# Patient Record
Sex: Female | Born: 1985
Health system: Southern US, Community
[De-identification: ages and names within clinical notes are randomized; demographics above are authoritative.]

## PROBLEM LIST (undated history)

## (undated) DIAGNOSIS — M1990 Unspecified osteoarthritis, unspecified site: Secondary | ICD-10-CM

## (undated) DIAGNOSIS — T8859XA Other complications of anesthesia, initial encounter: Secondary | ICD-10-CM

## (undated) DIAGNOSIS — J45909 Unspecified asthma, uncomplicated: Secondary | ICD-10-CM

## (undated) DIAGNOSIS — T4145XA Adverse effect of unspecified anesthetic, initial encounter: Secondary | ICD-10-CM

## (undated) DIAGNOSIS — K219 Gastro-esophageal reflux disease without esophagitis: Secondary | ICD-10-CM

## (undated) HISTORY — DX: Unspecified asthma, uncomplicated: J45.909

---

## 2010-08-04 ENCOUNTER — Other Ambulatory Visit (HOSPITAL_COMMUNITY): Payer: Self-pay | Admitting: General Surgery

## 2010-08-17 ENCOUNTER — Ambulatory Visit (HOSPITAL_COMMUNITY): Admit: 2010-08-17 | Payer: Self-pay | Admitting: General Surgery

## 2010-08-17 ENCOUNTER — Ambulatory Visit (HOSPITAL_COMMUNITY)
Admission: RE | Admit: 2010-08-17 | Discharge: 2010-08-17 | Disposition: A | Discharge status: Home / Self Care | Payer: BC Managed Care – PPO | Source: Ambulatory Visit | Attending: General Surgery | Admitting: General Surgery

## 2010-08-17 DIAGNOSIS — Z01818 Encounter for other preprocedural examination: Secondary | ICD-10-CM | POA: Insufficient documentation

## 2010-08-26 ENCOUNTER — Ambulatory Visit (HOSPITAL_COMMUNITY)
Admission: RE | Admit: 2010-08-26 | Discharge: 2010-08-26 | Disposition: A | Discharge status: Home / Self Care | Payer: BC Managed Care – PPO | Source: Ambulatory Visit | Attending: General Surgery | Admitting: General Surgery

## 2010-08-26 ENCOUNTER — Encounter (HOSPITAL_COMMUNITY): Payer: Self-pay

## 2010-08-26 ENCOUNTER — Ambulatory Visit: Payer: Self-pay | Admitting: *Deleted

## 2010-08-26 DIAGNOSIS — Z6839 Body mass index (BMI) 39.0-39.9, adult: Secondary | ICD-10-CM | POA: Insufficient documentation

## 2010-08-30 ENCOUNTER — Encounter: Payer: BC Managed Care – PPO | Attending: General Surgery | Admitting: *Deleted

## 2010-08-30 DIAGNOSIS — Z01818 Encounter for other preprocedural examination: Secondary | ICD-10-CM | POA: Insufficient documentation

## 2010-08-30 DIAGNOSIS — Z713 Dietary counseling and surveillance: Secondary | ICD-10-CM | POA: Insufficient documentation

## 2010-11-09 ENCOUNTER — Emergency Department (HOSPITAL_COMMUNITY)
Admission: EM | Admit: 2010-11-09 | Discharge: 2010-11-09 | Disposition: A | Discharge status: Home / Self Care | Payer: BC Managed Care – PPO | Attending: Emergency Medicine | Admitting: Emergency Medicine

## 2010-11-09 DIAGNOSIS — F411 Generalized anxiety disorder: Secondary | ICD-10-CM | POA: Insufficient documentation

## 2010-11-09 DIAGNOSIS — F988 Other specified behavioral and emotional disorders with onset usually occurring in childhood and adolescence: Secondary | ICD-10-CM | POA: Insufficient documentation

## 2010-11-09 DIAGNOSIS — Z79899 Other long term (current) drug therapy: Secondary | ICD-10-CM | POA: Insufficient documentation

## 2010-11-09 DIAGNOSIS — E669 Obesity, unspecified: Secondary | ICD-10-CM | POA: Insufficient documentation

## 2010-11-09 DIAGNOSIS — Y990 Civilian activity done for income or pay: Secondary | ICD-10-CM | POA: Insufficient documentation

## 2010-11-09 DIAGNOSIS — S61209A Unspecified open wound of unspecified finger without damage to nail, initial encounter: Secondary | ICD-10-CM | POA: Insufficient documentation

## 2010-11-09 DIAGNOSIS — W292XXA Contact with other powered household machinery, initial encounter: Secondary | ICD-10-CM | POA: Insufficient documentation

## 2010-11-09 DIAGNOSIS — IMO0002 Reserved for concepts with insufficient information to code with codable children: Secondary | ICD-10-CM | POA: Insufficient documentation

## 2013-02-13 ENCOUNTER — Ambulatory Visit (HOSPITAL_COMMUNITY)
Admission: RE | Admit: 2013-02-13 | Discharge: 2013-02-13 | Disposition: A | Discharge status: Home / Self Care | Payer: Federal, State, Local not specified - PPO | Source: Ambulatory Visit | Attending: Internal Medicine | Admitting: Internal Medicine

## 2013-02-13 ENCOUNTER — Other Ambulatory Visit (HOSPITAL_COMMUNITY): Payer: Self-pay | Admitting: Internal Medicine

## 2013-02-13 DIAGNOSIS — M79609 Pain in unspecified limb: Secondary | ICD-10-CM | POA: Insufficient documentation

## 2013-02-13 DIAGNOSIS — R209 Unspecified disturbances of skin sensation: Secondary | ICD-10-CM | POA: Insufficient documentation

## 2013-02-13 NOTE — Progress Notes (Signed)
*  PRELIMINARY RESULTS* Vascular Ultrasound Left lower extremity venous duplex has been completed.  Preliminary findings: negative for DVT and baker's cyst.   Report called to Dewayne Hatch.   Farrel Demark, RDMS, RVT  02/13/2013, 2:59 PM

## 2013-03-29 ENCOUNTER — Ambulatory Visit: Payer: Federal, State, Local not specified - PPO | Admitting: Neurology

## 2015-11-03 DIAGNOSIS — K080 Exfoliation of teeth due to systemic causes: Secondary | ICD-10-CM | POA: Diagnosis not present

## 2016-03-05 DIAGNOSIS — J069 Acute upper respiratory infection, unspecified: Secondary | ICD-10-CM | POA: Diagnosis not present

## 2016-03-05 DIAGNOSIS — H6691 Otitis media, unspecified, right ear: Secondary | ICD-10-CM | POA: Diagnosis not present

## 2016-03-30 DIAGNOSIS — Z0000 Encounter for general adult medical examination without abnormal findings: Secondary | ICD-10-CM | POA: Diagnosis not present

## 2016-04-07 DIAGNOSIS — F909 Attention-deficit hyperactivity disorder, unspecified type: Secondary | ICD-10-CM | POA: Diagnosis not present

## 2016-04-07 DIAGNOSIS — Z23 Encounter for immunization: Secondary | ICD-10-CM | POA: Diagnosis not present

## 2016-04-07 DIAGNOSIS — Z0000 Encounter for general adult medical examination without abnormal findings: Secondary | ICD-10-CM | POA: Diagnosis not present

## 2016-04-20 DIAGNOSIS — M79641 Pain in right hand: Secondary | ICD-10-CM | POA: Diagnosis not present

## 2016-05-04 DIAGNOSIS — F909 Attention-deficit hyperactivity disorder, unspecified type: Secondary | ICD-10-CM | POA: Diagnosis not present

## 2016-05-05 DIAGNOSIS — M79641 Pain in right hand: Secondary | ICD-10-CM | POA: Diagnosis not present

## 2016-05-07 DIAGNOSIS — M79644 Pain in right finger(s): Secondary | ICD-10-CM | POA: Diagnosis not present

## 2016-05-10 DIAGNOSIS — K080 Exfoliation of teeth due to systemic causes: Secondary | ICD-10-CM | POA: Diagnosis not present

## 2016-05-10 DIAGNOSIS — S63619D Unspecified sprain of unspecified finger, subsequent encounter: Secondary | ICD-10-CM | POA: Diagnosis not present

## 2016-05-31 DIAGNOSIS — S63619D Unspecified sprain of unspecified finger, subsequent encounter: Secondary | ICD-10-CM | POA: Diagnosis not present

## 2016-06-17 DIAGNOSIS — L400 Psoriasis vulgaris: Secondary | ICD-10-CM | POA: Diagnosis not present

## 2016-08-30 DIAGNOSIS — Z6841 Body Mass Index (BMI) 40.0 and over, adult: Secondary | ICD-10-CM | POA: Diagnosis not present

## 2016-08-30 DIAGNOSIS — R3915 Urgency of urination: Secondary | ICD-10-CM | POA: Diagnosis not present

## 2016-08-30 DIAGNOSIS — Z01411 Encounter for gynecological examination (general) (routine) with abnormal findings: Secondary | ICD-10-CM | POA: Diagnosis not present

## 2016-09-01 DIAGNOSIS — L409 Psoriasis, unspecified: Secondary | ICD-10-CM | POA: Diagnosis not present

## 2016-09-01 DIAGNOSIS — M1990 Unspecified osteoarthritis, unspecified site: Secondary | ICD-10-CM | POA: Diagnosis not present

## 2016-09-05 DIAGNOSIS — M25562 Pain in left knee: Secondary | ICD-10-CM | POA: Diagnosis not present

## 2016-09-05 DIAGNOSIS — M25561 Pain in right knee: Secondary | ICD-10-CM | POA: Diagnosis not present

## 2016-09-05 DIAGNOSIS — M79643 Pain in unspecified hand: Secondary | ICD-10-CM | POA: Diagnosis not present

## 2016-09-05 DIAGNOSIS — L409 Psoriasis, unspecified: Secondary | ICD-10-CM | POA: Diagnosis not present

## 2016-09-05 DIAGNOSIS — M2550 Pain in unspecified joint: Secondary | ICD-10-CM | POA: Diagnosis not present

## 2016-09-05 DIAGNOSIS — M79641 Pain in right hand: Secondary | ICD-10-CM | POA: Diagnosis not present

## 2016-09-05 DIAGNOSIS — M79642 Pain in left hand: Secondary | ICD-10-CM | POA: Diagnosis not present

## 2016-09-05 DIAGNOSIS — M25569 Pain in unspecified knee: Secondary | ICD-10-CM | POA: Diagnosis not present

## 2016-09-05 DIAGNOSIS — L4050 Arthropathic psoriasis, unspecified: Secondary | ICD-10-CM | POA: Diagnosis not present

## 2016-10-04 DIAGNOSIS — Z0000 Encounter for general adult medical examination without abnormal findings: Secondary | ICD-10-CM | POA: Diagnosis not present

## 2016-10-11 DIAGNOSIS — F419 Anxiety disorder, unspecified: Secondary | ICD-10-CM | POA: Diagnosis not present

## 2016-10-11 DIAGNOSIS — F909 Attention-deficit hyperactivity disorder, unspecified type: Secondary | ICD-10-CM | POA: Diagnosis not present

## 2016-10-11 DIAGNOSIS — R739 Hyperglycemia, unspecified: Secondary | ICD-10-CM | POA: Diagnosis not present

## 2016-10-12 ENCOUNTER — Other Ambulatory Visit (HOSPITAL_COMMUNITY): Payer: Self-pay | Admitting: Surgery

## 2016-10-20 ENCOUNTER — Encounter: Payer: Self-pay | Admitting: Registered"

## 2016-10-20 ENCOUNTER — Encounter: Payer: Federal, State, Local not specified - PPO | Attending: Surgery | Admitting: Registered"

## 2016-10-20 DIAGNOSIS — E669 Obesity, unspecified: Secondary | ICD-10-CM

## 2016-10-20 DIAGNOSIS — Z6839 Body mass index (BMI) 39.0-39.9, adult: Secondary | ICD-10-CM | POA: Diagnosis not present

## 2016-10-20 NOTE — Progress Notes (Signed)
Pre-Op Assessment Visit:  Pre-Operative Sleeve Gastrectomy Surgery  Medical Nutrition Therapy:  Appt start time: 2:05   End time:  3:00  Patient was seen on 10/20/2016 for Pre-Operative Nutrition Assessment. Assessment and letter of approval faxed to [facility] Surgery Bariatric Surgery Program coordinator on 10/20/2016.   Pt expectation of surgery: joints to feel better, lose weight  Pt expectation of Dietitian: foods and vitamins recommendations  Start weight at [facility]: 243.9 BMI: 39.97  Pt prefers to be called Abigail Morales. Pt states that she went through process for bariatric surgery in 2014 but did not submit to insurance. Pt states she felt she was not ready at that time, but she is ready now. Pt states she has been recently diagnosed with psoratic arthritis. Pt states she has eliminated fried foods to help manage arthritis. Pt reports she used to participate in Crossfit and lost about 40lbs but discontinued due to joint pain. Pt states she walks her 2 dogs about 60-90 min/day.   Pt states she needs assessment + 3 SWL visits.  24 hr Dietary Recall: First Meal: frozen omelet Snack: cheese, nuts, raisins Second Meal: shrimp and rice pepper bowl Snack: none Third Meal: 2 stuffed peppers  Snack: none Beverages: water, sometimes Sprite (2-3 times/wk)  Encouraged to engage in 150 minutes of moderate physical activity including cardiovascular and weight baring weekly  Handouts given during visit include:  . Pre-Op Goals . Bariatric Surgery Protein Shakes During the appointment today the following Pre-Op Goals were reviewed with the patient: . Maintain or lose weight as instructed by your surgeon . Make healthy food choices . Begin to limit portion sizes . Limited concentrated sugars and fried foods . Keep fat/sugar in the single digits per serving on             food labels . Practice CHEWING your food  (aim for 30 chews per bite or until applesauce consistency) . Practice not  drinking 15 minutes before, during, and 30 minutes after each meal/snack . Avoid all carbonated beverages  . Avoid/limit caffeinated beverages  . Avoid all sugar-sweetened beverages . Consume 3 meals per day; eat every 3-5 hours . Make a list of non-food related activities . Aim for 64-100 ounces of FLUID daily  . Aim for at least 60-80 grams of PROTEIN daily . Look for a liquid protein source that contain ?15 g protein and ?5 g carbohydrate  (ex: shakes, drinks, shots)  -Follow diet recommendations listed below   Energy and Macronutrient Recomendations: Calories: 1600 Carbohydrate: 180 Protein: 120 Fat: 44  Demonstrated degree of understanding via:  Teach Back  Teaching Method Utilized:  Visual Auditory Hands on  Barriers to learning/adherence to lifestyle change: none  Patient to call the [facility] to enroll in Pre-Op and Post-Op Nutrition Education when surgery date is scheduled.

## 2016-10-20 NOTE — Patient Instructions (Addendum)
-   Practice CHEWING your food  (aim for 30 chews per bite or until applesauce consistency)  - Practice not drinking 15 minutes before, during, and 30 minutes after each meal/snack  - Avoid all carbonated beverages

## 2016-10-25 ENCOUNTER — Other Ambulatory Visit: Payer: Self-pay

## 2016-10-25 ENCOUNTER — Ambulatory Visit (HOSPITAL_COMMUNITY)
Admission: RE | Admit: 2016-10-25 | Discharge: 2016-10-25 | Disposition: A | Discharge status: Home / Self Care | Payer: Federal, State, Local not specified - PPO | Source: Ambulatory Visit | Attending: Surgery | Admitting: Surgery

## 2016-10-25 DIAGNOSIS — Z01810 Encounter for preprocedural cardiovascular examination: Secondary | ICD-10-CM | POA: Insufficient documentation

## 2016-10-25 DIAGNOSIS — Z01818 Encounter for other preprocedural examination: Secondary | ICD-10-CM | POA: Diagnosis not present

## 2016-10-27 DIAGNOSIS — F509 Eating disorder, unspecified: Secondary | ICD-10-CM | POA: Diagnosis not present

## 2016-10-31 ENCOUNTER — Encounter: Payer: Self-pay | Admitting: Registered"

## 2016-10-31 ENCOUNTER — Encounter: Payer: Federal, State, Local not specified - PPO | Admitting: Registered"

## 2016-10-31 DIAGNOSIS — E669 Obesity, unspecified: Secondary | ICD-10-CM

## 2016-10-31 DIAGNOSIS — Z6839 Body mass index (BMI) 39.0-39.9, adult: Secondary | ICD-10-CM | POA: Diagnosis not present

## 2016-10-31 NOTE — Progress Notes (Signed)
Appt start time: 5:00 end time: 5:15  Assessment: 1st SWL Appointment.   Start Wt at [facility]: 243.9 Wt: 246.5 BMI: 40.4   Pt prefers to be called [place]. Pt arrives having gained 2.5 pounds. Pt states she has mini fridge in her office at work and able to add snacks and eat them throughout the day. Pt states she still walks her dogs 60-90 min/day as physical activity. Pt states she's had flare ups last weekend with arthritis due to doing a lot of yard work. Pt reports she's only had 1 carbonated beverage since our last visit and has been doing well with chewing 30 times per bite. Pt also states she has been more conscious of her drinking at meals and able to wait 15 min prior to meal, not drink during meal, and wait 30 min after meal to drink.   Pt states she needs assessment + 3 SWL visits.  Preferred Learning Style:   No preference indicated   Learning Readiness:   Ready  Change in progress  MEDICATIONS: See list   DIETARY INTAKE:  24-hr recall:  B ( AM): frozen omelet with pico  Snk ( AM): none  L ( PM): cheese, chicken, and green peppers  Snk ( PM): none D ( PM): steak, vegetable Snk ( PM): none Beverages: water, orange juice   Usual physical activity: walking dogs 60-90 min/day  Diet to Follow: 1600 calories 180 g carbohydrates 120 g protein 44 g fat   Nutritional Diagnosis:  [place]-3.3 Overweight/obesity related to past poor dietary habits and physical inactivity as evidenced by patient w/ upcoming sleeve gastrectomy surgery following dietary guidelines for continued weight loss.    Intervention:  Nutrition counseling for upcoming Bariatric Surgery.  Teaching Method Utilized:  Visual Auditory   Handouts given during visit include:  101 Things to do besides eat  Barriers to learning/adherence to lifestyle change: none   Demonstrated degree of understanding via:  Teach Back   Monitoring/Evaluation:  Dietary intake, exercise, and body weight in 1 month(s).

## 2016-10-31 NOTE — Patient Instructions (Addendum)
-   Adding in string cheese and greek yogurt for mini fridge at work.  - Make a list of things to do when feeling stressed, bored, emotional, etc.

## 2016-11-07 ENCOUNTER — Ambulatory Visit: Payer: Federal, State, Local not specified - PPO | Admitting: Registered"

## 2016-11-08 DIAGNOSIS — G5601 Carpal tunnel syndrome, right upper limb: Secondary | ICD-10-CM | POA: Diagnosis not present

## 2016-11-15 DIAGNOSIS — K080 Exfoliation of teeth due to systemic causes: Secondary | ICD-10-CM | POA: Diagnosis not present

## 2016-11-17 DIAGNOSIS — F509 Eating disorder, unspecified: Secondary | ICD-10-CM | POA: Diagnosis not present

## 2016-11-29 ENCOUNTER — Encounter: Payer: Self-pay | Admitting: Registered"

## 2016-11-29 ENCOUNTER — Encounter: Payer: Federal, State, Local not specified - PPO | Attending: Surgery | Admitting: Registered"

## 2016-11-29 DIAGNOSIS — Z6839 Body mass index (BMI) 39.0-39.9, adult: Secondary | ICD-10-CM | POA: Insufficient documentation

## 2016-11-29 DIAGNOSIS — E669 Obesity, unspecified: Secondary | ICD-10-CM

## 2016-11-29 NOTE — Patient Instructions (Addendum)
-   Aim for at least 64 oz of fluid intake daily.   - Find a liquid protein shake option or protein powder that you enjoy with at least 15g of protein and 5g or less of carbohydrates.   - Can mix protein powder with low fat milk, fat free milk, unsweetened almond milk, or water.

## 2016-11-29 NOTE — Progress Notes (Signed)
Appt start time: 12:00 end time: 12:15  Assessment: 2nd SWL Appointment.   Start Wt at [facility]: 243.9 Wt: 251.5 BMI: 41.22   Pt prefers to be called Abigail Morales. Pt arrives having gained 5 lbs from previous visit. Pt states she has mini fridge in her office at work and able to add snacks and eat them throughout the day. Pt states she still walks her dogs 60-90 min/day as physical activity. Pt states she ate a lot this (Mem[facility] Day) weekend. Pt states she is not sure if she is consistently getting 64 oz of fluid daily. Pt states she did not like the protein shakes that she has tried so far. Pt states she has started eating greek yogurt, string cheese, almonds, and fruit as snacks during her day.   Pt states she needs assessment + 3 SWL visits.   Preferred Learning Style:   No preference indicated   Learning Readiness:   Ready  Change in progress  MEDICATIONS: See list   DIETARY INTAKE:  24-hr recall:  B ( AM): frozen omelet with pico or boiled eggs Snk ( AM): cheese stick  L ( PM): leftovers (stuffed peppers-beef, rice, seasoning) Snk ( PM): fruit, almonds D ( PM): tacos Snk ( PM): none Beverages: water, orange juice sometimes, 2% milk (rarely)   Usual physical activity: walking dogs 60-90 min/day  Diet to Follow: 1600 calories 180 g carbohydrates 120 g protein 44 g fat   Nutritional Diagnosis:  [place]-3.3 Overweight/obesity related to past poor dietary habits and physical inactivity as evidenced by patient w/ upcoming sleeve gastrectomy surgery following dietary guidelines for continued weight loss.    Intervention:  Nutrition counseling for upcoming Bariatric Surgery.  Goals: - Aim for at least 64 oz of fluid intake daily.  - Find a liquid protein shake option or protein powder that you enjoy with at least 15g of protein and 5g or less of carbohydrates.  - Can mix protein powder with low fat milk, fat free milk, unsweetened almond milk, or water.  Teaching Method Utilized:   Visual Auditory   Handouts given during visit include:  none  Barriers to learning/adherence to lifestyle change: none   Demonstrated degree of understanding via:  Teach Back   Monitoring/Evaluation:  Dietary intake, exercise, and body weight in 1 month(s).

## 2016-11-30 DIAGNOSIS — L550 Sunburn of first degree: Secondary | ICD-10-CM | POA: Diagnosis not present

## 2016-12-08 DIAGNOSIS — M25562 Pain in left knee: Secondary | ICD-10-CM | POA: Diagnosis not present

## 2016-12-16 DIAGNOSIS — M25562 Pain in left knee: Secondary | ICD-10-CM | POA: Diagnosis not present

## 2016-12-20 DIAGNOSIS — M25562 Pain in left knee: Secondary | ICD-10-CM | POA: Diagnosis not present

## 2016-12-23 DIAGNOSIS — M23242 Derangement of anterior horn of lateral meniscus due to old tear or injury, left knee: Secondary | ICD-10-CM | POA: Diagnosis not present

## 2016-12-23 DIAGNOSIS — G8918 Other acute postprocedural pain: Secondary | ICD-10-CM | POA: Diagnosis not present

## 2016-12-23 DIAGNOSIS — M94262 Chondromalacia, left knee: Secondary | ICD-10-CM | POA: Diagnosis not present

## 2016-12-23 DIAGNOSIS — M659 Synovitis and tenosynovitis, unspecified: Secondary | ICD-10-CM | POA: Diagnosis not present

## 2016-12-23 DIAGNOSIS — M65162 Other infective (teno)synovitis, left knee: Secondary | ICD-10-CM | POA: Diagnosis not present

## 2016-12-23 HISTORY — PX: OTHER SURGICAL HISTORY: SHX169

## 2016-12-26 ENCOUNTER — Encounter: Payer: Self-pay | Admitting: Registered"

## 2016-12-26 ENCOUNTER — Encounter: Payer: Federal, State, Local not specified - PPO | Attending: Surgery | Admitting: Registered"

## 2016-12-26 DIAGNOSIS — Z6839 Body mass index (BMI) 39.0-39.9, adult: Secondary | ICD-10-CM | POA: Diagnosis not present

## 2016-12-26 DIAGNOSIS — E669 Obesity, unspecified: Secondary | ICD-10-CM

## 2016-12-26 NOTE — Patient Instructions (Signed)
-   Continue to practice chewing at least 30 times per bite.  - Continue to not drink 15 min prior to eating, not while eating, and waiting 30 minutes after eating.

## 2016-12-26 NOTE — Progress Notes (Signed)
Appt start time: 4:59 end time: 5:15  Assessment: 3rd SWL Appointment.   Start Wt at [facility]: 243.9 Wt: 248.5 BMI: 40.72   Pt prefers to be called Abigail Morales. Pt arrives having lost 3 lbs from previous visit. Pt states she just had knee surgery last week due to torn meniscus. Pt states she has found a variety of protein shakes that she enjoys and drinks more than 64 oz of fluid daily.    This is the last SWL visit needed prior to surgery.    Preferred Learning Style:   No preference indicated   Learning Readiness:   Ready  Change in progress  MEDICATIONS: See list   DIETARY INTAKE:  24-hr recall:  B ( AM): boiled eggs Snk ( AM): cheese stick or watermelon  L ( PM): leftovers (stuffed peppers-beef, rice, seasoning) Snk ( PM): fruit, almonds D ( PM): smoked pork loin, sometimes vegetable (corn, broccoli, asparagus) Snk ( PM): none Beverages: water, orange juice sometimes, 2% milk (rarely), sprite, and ginger ale   Usual physical activity: walking dogs 60-90 min/day  Diet to Follow: 1600 calories 180 g carbohydrates 120 g protein 44 g fat   Nutritional Diagnosis:  [place]-3.3 Overweight/obesity related to past poor dietary habits and physical inactivity as evidenced by patient w/ upcoming sleeve gastrectomy surgery following dietary guidelines for continued weight loss.    Intervention:  Nutrition counseling for upcoming Bariatric Surgery.  Goals: - Continue to practice chewing at least 30 times per bite. - Continue to not drink 15 min prior to eating, not while eating, and waiting 30 minutes after eating.   Teaching Method Utilized:  Visual Auditory   Handouts given during visit include:  none  Barriers to learning/adherence to lifestyle change: none   Demonstrated degree of understanding via:  Teach Back   Monitoring/Evaluation:  Dietary intake, exercise, and body weight prn.

## 2016-12-29 DIAGNOSIS — Z9889 Other specified postprocedural states: Secondary | ICD-10-CM | POA: Diagnosis not present

## 2017-01-12 DIAGNOSIS — M25562 Pain in left knee: Secondary | ICD-10-CM | POA: Diagnosis not present

## 2017-01-12 DIAGNOSIS — M25462 Effusion, left knee: Secondary | ICD-10-CM | POA: Diagnosis not present

## 2017-01-20 DIAGNOSIS — M25562 Pain in left knee: Secondary | ICD-10-CM | POA: Diagnosis not present

## 2017-01-30 ENCOUNTER — Encounter: Payer: Federal, State, Local not specified - PPO | Attending: Surgery | Admitting: Registered"

## 2017-01-30 ENCOUNTER — Encounter: Payer: Self-pay | Admitting: Registered"

## 2017-01-30 ENCOUNTER — Ambulatory Visit: Payer: Federal, State, Local not specified - PPO

## 2017-01-30 DIAGNOSIS — Z6839 Body mass index (BMI) 39.0-39.9, adult: Secondary | ICD-10-CM | POA: Insufficient documentation

## 2017-01-30 DIAGNOSIS — E669 Obesity, unspecified: Secondary | ICD-10-CM

## 2017-01-30 NOTE — Progress Notes (Signed)
Appt start time: 2:00 end time: 2:15  Assessment: 4th SWL Appointment.   Start Wt at [facility]: 243.9 Wt: 252.9 BMI: 41.44   Pt prefers to be called Lowella BandyNikki. Pt arrives having gained 4.4 lbs from previous visit. Pt states she needed one more visit with us. Pt states she has been working on chewing and not drinking with meals. Pt states she has eliminated ginger ale from from diet because she is not taking medications anymore that used to make her feel sick. Pt states she has not been able to talk as much as before due to recent knee surgery but still walks her dogs about 4-5x/day, 15 min each time.   This is the last SWL visit needed prior to surgery.     Preferred Learning Style:   No preference indicated   Learning Readiness:   Ready  Change in progress  MEDICATIONS: See list   DIETARY INTAKE:  24-hr recall:  B ( AM): boiled eggs Snk ( AM): cheese stick or watermelon  L ( PM): leftovers (stuffed peppers-beef, rice, seasoning) Snk ( PM): fruit, almonds D ( PM): smoked pork loin, sometimes vegetable (corn, broccoli, asparagus) Snk ( PM): none Beverages: water, orange juice sometimes, 2% milk (rarely), sprite  Usual physical activity: walking dogs 15 min/4-5x day   Diet to Follow: 1600 calories 180 g carbohydrates 120 g protein 44 g fat   Nutritional Diagnosis:  [place]-3.3 Overweight/obesity related to past poor dietary habits and physical inactivity as evidenced by patient w/ upcoming sleeve gastrectomy surgery following dietary guidelines for continued weight loss.    Intervention:  Nutrition counseling for upcoming Bariatric Surgery.  Goals: - Get Bariatric-specific vitamins and calcium supplements prior to surgery.  Teaching Method Utilized:  Visual Auditory   Handouts given during visit include:  Vitamin and Mineral Recommendations  Barriers to learning/adherence to lifestyle change: none   Demonstrated degree of understanding via:  Teach Back    Monitoring/Evaluation:  Dietary intake, exercise, and body weight prn.

## 2017-01-30 NOTE — Patient Instructions (Addendum)
-   Get Bariatric-specific vitamins and calcium supplements prior to surgery.

## 2017-02-02 DIAGNOSIS — M25462 Effusion, left knee: Secondary | ICD-10-CM | POA: Diagnosis not present

## 2017-02-13 ENCOUNTER — Ambulatory Visit: Payer: Federal, State, Local not specified - PPO | Admitting: Skilled Nursing Facility1

## 2017-02-14 DIAGNOSIS — Z9889 Other specified postprocedural states: Secondary | ICD-10-CM | POA: Diagnosis not present

## 2017-02-16 DIAGNOSIS — M7989 Other specified soft tissue disorders: Secondary | ICD-10-CM | POA: Diagnosis not present

## 2017-02-17 DIAGNOSIS — M79672 Pain in left foot: Secondary | ICD-10-CM | POA: Diagnosis not present

## 2017-02-21 DIAGNOSIS — L4059 Other psoriatic arthropathy: Secondary | ICD-10-CM | POA: Diagnosis not present

## 2017-02-21 DIAGNOSIS — L409 Psoriasis, unspecified: Secondary | ICD-10-CM | POA: Diagnosis not present

## 2017-02-23 ENCOUNTER — Ambulatory Visit: Payer: Self-pay | Admitting: Surgery

## 2017-02-23 NOTE — H&P (Signed)
[place] Patient #: [id number] DOB: Jul 22, 1985 Married / Language: English / Race: White Female   History of Present Illness Patient words: She works as a Radio broadcast assistant She is married, and she and her husband hope to start their family in 2 years or so. No tobacco, alcohol or drug use. Recently diagnosed with psoriatic arthritis (?). Borderline htn but has not required meds Has reflux, takes antacids daily Mom has diabetes, mult family members with hypertension.  The patient is a 31 year old female who presents for a bariatric surgery evaluation. Associated symptoms include poor self esteem, joint pains and heartburn. Initial onset of obesity was in childhood. Patient's highest weight was 245 pounds. Patient's lowest weight in adulthood was 180 pounds (in high school. has gotten down to 220 with crossfit.). Disease complications include osteoarthritis (psoriatic arthritis). Current diet includes well balanced meals. a daily and walking. Pertinent family history includes obesity, diabetes and family member has had bariatric surgery (mom had a rygb). The patient is currently able to do activities of daily living without limitations, able to work without limitations, able to do housework without limitations and able to participate in sports without limitations. Past evaluation has included sleep study (5 years ago, negative). Past treatment has included low calorie diet and exercise regimen (cross fit). Psychiatric History: The patient denies depression, bipolar disorder, anxiety or psychiatric hospitalizations. Cardiac history: The patient denies history of angina, history of stroke, pulmonary embolus, sleep apnea or smoking. Gastrointestinal History: Patient has heartburn, and denies dysphagia or irritable bowel disease. MBSQIP recognized comorbidities: Patient has gastroesophageal reflux disease. She has completed the preoperative workup and no barriers have been identified.   Since we first met  she has undergone left knee surgery for a meniscal tear and states there was debris in her synovial fluid. She has seen a rheumatologist and completed a dose pack of prednisone a few weeks ago; she states they are considering starting her on an injectable immunosuppressant.  Psych eval: complete (Dr. Ardath Sax)- approved Nutrition eval- complete, approved UGI- negative, no hiatal hernia CXR- negative   Problem List/Past Medical  MORBID OBESITY (E66.01)   Past Surgical History  No pertinent past surgical history   Diagnostic Studies History  Colonoscopy  never Mammogram  never Pap Smear  1-5 years ago  Allergies  Robitussin 12 Hour Cough *COUGH/COLD/ALLERGY*  Allergies Reconciled   Medication History  Microgestin 1/20 (1-'20MG'$ -MCG Tablet, Oral) Active. Adderall ('10MG'$  Tablet, Oral three times daily, as needed) Active. ProAir HFA (108 (90 Base)MCG/ACT Aerosol Soln, Inhalation) Active. Zantac ('150MG'$  Tablet, Oral) Active.  Social History Alcohol use  Occasional alcohol use. Caffeine use  Carbonated beverages. No drug use  Tobacco use  Never smoker.  Family History Depression  Mother. Diabetes Mellitus  Father, Mother. Heart disease in female family member before age 43  Heart disease in female family member before age 49  Hypertension  Mother, Sister.  Pregnancy / Birth History Age at menarche  2 years. Contraceptive History  Oral contraceptives. Gravida  0 Para  0 Regular periods   Other Problems  Anxiety Disorder  Arthritis  Asthma  Back Pain  Bladder Problems  Gastroesophageal Reflux Disease     Review of Systems All other systems negative   Physical Exam General Mental Status-Alert. General Appearance-Cooperative. Orientation-Oriented X4. Build & Nutrition-Obese. Posture-Normal posture.  Integumentary Global Assessment Normal Exam - Head/Face: no rashes, ulcers, lesions or evidence of photo damage. No palpable  nodules or masses and Neck: no visible lesions or palpable masses.  Head and Neck Head-normocephalic, atraumatic with no lesions or palpable masses. Face Global Assessment - atraumatic. Thyroid Gland Characteristics - normal size and consistency.  Eye Eyeball - Bilateral-Extraocular movements intact. Sclera/Conjunctiva - Bilateral-No scleral icterus, No Discharge.  ENMT Nose and Sinuses Nose - no deformities observed, no swelling present.  Chest and Lung Exam Palpation Normal exam - Non-tender. Auscultation Breath sounds - Normal.  Cardiovascular Auscultation Rhythm - Regular. Heart Sounds - S1 WNL and S2 WNL. Carotid arteries - No Carotid bruit.  Abdomen Inspection Normal Exam - No Visible peristalsis, No Abnormal pulsations and No Paradoxical movements. Palpation/Percussion Normal exam - Soft, Non Tender, No Rebound tenderness, No Rigidity (guarding), No hepatosplenomegaly and No Palpable abdominal masses.  Peripheral Vascular Upper Extremity Palpation - Pulses bilaterally normal. Lower Extremity Palpation - Edema - Bilateral - No edema.  Neurologic Neurologic evaluation reveals -normal sensation and normal coordination.  Neuropsychiatric Mental status exam performed with findings of-able to articulate well with normal speech/language, rate, volume and coherence and thought content normal with ability to perform basic computations and apply abstract reasoning.  Musculoskeletal Normal Exam - Bilateral-Upper Extremity Strength Normal and Lower Extremity Strength Normal.    Assessment & Plan  MORBID OBESITY (E66.01) Story: She struggled with obesity for her entire life. She is essentially otherwise healthy aside from recently diagnosed psoriatic arthritis. She had been seen initially in 2014 for bariatric surgery, but at that time she elected to give diet and exercise another shot. She walks or come daily, and she was doing cross that 6 days a week and  got as low as 220 but it was terrible for her joints. She also used to run. She is interested in surgery and weight loss before starting her family. We discussed that I recommend waiting at least a year after weight loss surgery before getting pregnant. We discussed both Roux-en-Y gastric bypass and sleeve gastrectomy. We discussed the technical features of each, expected weight loss and effects on comorbidities, and risks of each and the differences between them. I discussed with her that in terms of reflux, 10% of people have worsening symptoms after sleep, 10% have improvement in the rest have no change. We discussed risks of leaks, internal hernias, infection, scarring, pain, bleeding, blood clots. stroke etc. Given her young age and lack of significant comorbidities other than arthritis, I believe she is a good candidate for sleeve gastrectomy. I advised her that it would be preferable to wait a few months after.  Her surgery before initiating any sort of immunosuppressive therapy, and if that needs to be started first it may be best to delay her surgery.  She will get back in touch with [place] after she meets with the rheumatologist later tek.

## 2017-02-27 ENCOUNTER — Encounter: Payer: Federal, State, Local not specified - PPO | Attending: Surgery | Admitting: Skilled Nursing Facility1

## 2017-02-27 DIAGNOSIS — Z6839 Body mass index (BMI) 39.0-39.9, adult: Secondary | ICD-10-CM | POA: Insufficient documentation

## 2017-02-27 DIAGNOSIS — E669 Obesity, unspecified: Secondary | ICD-10-CM

## 2017-03-01 ENCOUNTER — Encounter: Payer: Self-pay | Admitting: Skilled Nursing Facility1

## 2017-03-01 NOTE — Progress Notes (Signed)
Pre-Operative Nutrition Class:  Appt start time: 8250   End time:  1830.  Patient was seen on 02/27/2017 for Pre-Operative Bariatric Surgery Education at [facility].   Surgery date: 03/21/2017 Surgery type: Sleeve Start weight at [facility]: 243.9 Weight today: 246.5  Samples given per MNT protocol. Patient educated on appropriate usage: Bariatric Advantage Multivitamin Lot # [id number] Exp: 06/19  Bariatric Advantage Calcium  Lot # [id number] Exp: dec-19-2018  PremierProtein Shake Lot # [id number] Exp: 27/nov/2018  The following the learning objectives were met by the patient during this course:  Identify Pre-Op Dietary Goals and will begin 2 weeks pre-operatively  Identify appropriate sources of fluids and proteins   State protein recommendations and appropriate sources pre and post-operatively  Identify Post-Operative Dietary Goals and will follow for 2 weeks post-operatively  Identify appropriate multivitamin and calcium sources  Describe the need for physical activity post-operatively and will follow MD recommendations  State when to call healthcare provider regarding medication questions or post-operative complications  Handouts given during class include:  Pre-Op Bariatric Surgery Diet Handout  Protein Shake Handout  Post-Op Bariatric Surgery Nutrition Handout  BELT Program Information Flyer  Support Group Information Flyer  [facility] Bariatric Supplements Price List  Follow-Up Plan: Patient will follow-up at [facility] 2 weeks post operatively for diet advancement per MD.

## 2017-03-08 DIAGNOSIS — R5382 Chronic fatigue, unspecified: Secondary | ICD-10-CM | POA: Diagnosis not present

## 2017-03-08 DIAGNOSIS — M2550 Pain in unspecified joint: Secondary | ICD-10-CM | POA: Diagnosis not present

## 2017-03-14 NOTE — Progress Notes (Signed)
EKG 10-25-16 EPIC CHEST XRAY 2 VIEW 10-25-16 EPIC

## 2017-03-14 NOTE — Patient Instructions (Signed)
Laberta Lefever  03/14/2017   Your procedure is scheduled on: Tuesday 03-21-17  Report to [facility] Main  Entrance Take [place]  elevators to 3rd floor to  Short Stay Center at 900  AM.  Call this number if you have problems the morning of surgery [phone]    Remember: ONLY 1 PERSON MAY GO WITH YOU TO SHORT STAY TO GET  READY MORNING OF YOUR SURGERY.  Do not eat food or drink liquids :After Midnight.     Take these medicines the morning of surgery with A SIP OF WATER: NONE                               You may not have any metal on your body including hair pins and              piercings  Do not wear jewelry, make-up, lotions, powders or perfumes, deodorant             Do not wear nail polish.  Do not shave  48 hours prior to surgery.              Men may shave face and neck.   Do not bring valuables to the hospital. [facility] IS NOT             RESPONSIBLE   FOR VALUABLES.  Contacts, dentures or bridgework may not be worn into surgery.  Leave suitcase in the car. After surgery it may be brought to your room.                  Please read over the following fact sheets you were given: _____________________________________________________________________             [facility] - Preparing for Surgery Before surgery, you can play an important role.  Because skin is not sterile, your skin needs to be as free of germs as possible.  You can reduce the number of germs on your skin by washing with CHG (chlorahexidine gluconate) soap before surgery.  CHG is an antiseptic cleaner which kills germs and bonds with the skin to continue killing germs even after washing. Please DO NOT use if you have an allergy to CHG or antibacterial soaps.  If your skin becomes reddened/irritated stop using the CHG and inform your nurse when you arrive at Short Stay. Do not shave (including legs and underarms) for at least 48 hours prior to the first CHG shower.  You may shave  your face/neck. Please follow these instructions carefully:  1.  Shower with CHG Soap the night before surgery and the  morning of Surgery.  2.  If you choose to wash your hair, wash your hair first as usual with your  normal  shampoo.  3.  After you shampoo, rinse your hair and body thoroughly to remove the  shampoo.                           4.  Use CHG as you would any other liquid soap.  You can apply chg directly  to the skin and wash                       Gently with a scrungie or clean washcloth.  5.  Apply the CHG  Soap to your body ONLY FROM THE NECK DOWN.   Do not use on face/ open                           Wound or open sores. Avoid contact with eyes, ears mouth and genitals (private parts).                       Wash face,  Genitals (private parts) with your normal soap.             6.  Wash thoroughly, paying special attention to the area where your surgery  will be performed.  7.  Thoroughly rinse your body with warm water from the neck down.  8.  DO NOT shower/wash with your normal soap after using and rinsing off  the CHG Soap.                9.  Pat yourself dry with a clean towel.            10.  Wear clean pajamas.            11.  Place clean sheets on your bed the night of your first shower and do not  sleep with pets. Day of Surgery : Do not apply any lotions/deodorants the morning of surgery.  Please wear clean clothes to the hospital/surgery center.  FAILURE TO FOLLOW THESE INSTRUCTIONS MAY RESULT IN THE CANCELLATION OF YOUR SURGERY PATIENT SIGNATURE_________________________________  NURSE SIGNATURE__________________________________  ________________________________________________________________________   Adam Phenix  An incentive spirometer is a tool that can help keep your lungs clear and active. This tool measures how well you are filling your lungs with each breath. Taking long deep breaths may help reverse or decrease the chance of developing  breathing (pulmonary) problems (especially infection) following:  A long period of time when you are unable to move or be active. BEFORE THE PROCEDURE   If the spirometer includes an indicator to show your best effort, your nurse or respiratory therapist will set it to a desired goal.  If possible, sit up straight or lean slightly forward. Try not to slouch.  Hold the incentive spirometer in an upright position. INSTRUCTIONS FOR USE  1. Sit on the edge of your bed if possible, or sit up as far as you can in bed or on a chair. 2. Hold the incentive spirometer in an upright position. 3. Breathe out normally. 4. Place the mouthpiece in your mouth and seal your lips tightly around it. 5. Breathe in slowly and as deeply as possible, raising the piston or the ball toward the top of the column. 6. Hold your breath for 3-5 seconds or for as long as possible. Allow the piston or ball to fall to the bottom of the column. 7. Remove the mouthpiece from your mouth and breathe out normally. 8. Rest for a few seconds and repeat Steps 1 through 7 at least 10 times every 1-2 hours when you are awake. Take your time and take a few normal breaths between deep breaths. 9. The spirometer may include an indicator to show your best effort. Use the indicator as a goal to work toward during each repetition. 10. After each set of 10 deep breaths, practice coughing to be sure your lungs are clear. If you have an incision (the cut made at the time of surgery), support your incision when coughing by placing a pillow or rolled up towels  firmly against it. Once you are able to get out of bed, walk around indoors and cough well. You may stop using the incentive spirometer when instructed by your caregiver.  RISKS AND COMPLICATIONS  Take your time so you do not get dizzy or light-headed.  If you are in pain, you may need to take or ask for pain medication before doing incentive spirometry. It is harder to take a deep  breath if you are having pain. AFTER USE  Rest and breathe slowly and easily.  It can be helpful to keep track of a log of your progress. Your caregiver can provide you with a simple table to help with this. If you are using the spirometer at home, follow these instructions: SEEK MEDICAL CARE IF:   You are having difficultly using the spirometer.  You have trouble using the spirometer as often as instructed.  Your pain medication is not giving enough relief while using the spirometer.  You develop fever of 100.5 F (38.1 C) or higher. SEEK IMMEDIATE MEDICAL CARE IF:   You cough up bloody sputum that had not been present before.  You develop fever of 102 F (38.9 C) or greater.  You develop worsening pain at or near the incision site. MAKE SURE YOU:   Understand these instructions.  Will watch your condition.  Will get help right away if you are not doing well or get worse. Document Released: 10/31/2006 Document Revised: 09/12/2011 Document Reviewed: 01/01/2007 Exit[employer] Patient Information 2014 [employer]  MAKE SURE YOU ARE TURNING OFTEN IN BED AND DOING DEEP BREATHING EXERCISES EVERY 2 TO 3 HOURS WHILE YOU ARE AWAKE AFTER SURGERY FOR 3 WEEKS. ________________________________________________________________________

## 2017-03-15 ENCOUNTER — Encounter (HOSPITAL_COMMUNITY): Payer: Self-pay

## 2017-03-15 ENCOUNTER — Encounter (HOSPITAL_COMMUNITY)
Admission: RE | Admit: 2017-03-15 | Discharge: 2017-03-15 | Disposition: A | Discharge status: Home / Self Care | Payer: Federal, State, Local not specified - PPO | Source: Ambulatory Visit | Attending: Surgery | Admitting: Surgery

## 2017-03-15 DIAGNOSIS — Z01812 Encounter for preprocedural laboratory examination: Secondary | ICD-10-CM | POA: Diagnosis not present

## 2017-03-15 HISTORY — DX: Other complications of anesthesia, initial encounter: T88.59XA

## 2017-03-15 HISTORY — DX: Unspecified osteoarthritis, unspecified site: M19.90

## 2017-03-15 HISTORY — DX: Gastro-esophageal reflux disease without esophagitis: K21.9

## 2017-03-15 HISTORY — DX: Adverse effect of unspecified anesthetic, initial encounter: T41.45XA

## 2017-03-15 HISTORY — DX: Morbid (severe) obesity due to excess calories: E66.01

## 2017-03-15 LAB — CBC WITH DIFFERENTIAL/PLATELET
Basophils Absolute: 0 10*3/uL (ref 0.0–0.1)
Basophils Relative: 0 %
EOS PCT: 1 %
Eosinophils Absolute: 0.1 10*3/uL (ref 0.0–0.7)
HEMATOCRIT: 36.6 % (ref 36.0–46.0)
Hemoglobin: 12 g/dL (ref 12.0–15.0)
LYMPHS ABS: 2.4 10*3/uL (ref 0.7–4.0)
LYMPHS PCT: 23 %
MCH: 26.6 pg (ref 26.0–34.0)
MCHC: 32.8 g/dL (ref 30.0–36.0)
MCV: 81.2 fL (ref 78.0–100.0)
MONO ABS: 0.6 10*3/uL (ref 0.1–1.0)
MONOS PCT: 6 %
Neutro Abs: 7.5 10*3/uL (ref 1.7–7.7)
Neutrophils Relative %: 70 %
PLATELETS: 287 10*3/uL (ref 150–400)
RBC: 4.51 MIL/uL (ref 3.87–5.11)
RDW: 15.5 % (ref 11.5–15.5)
WBC: 10.6 10*3/uL — ABNORMAL HIGH (ref 4.0–10.5)

## 2017-03-15 NOTE — Progress Notes (Signed)
CMET,  CBC WITH DIF 03-08-17 [place] [facility] ON CHART, REPEATED CBC WITH DIF 03-15-17 DUE TO ELEVATED WHITE COUNT.

## 2017-03-15 NOTE — Progress Notes (Signed)
Patient stated dr Fredricka Bonineconnor told her she can continue birth control pill and does not need to stop birth control pill for surgery.

## 2017-03-16 DIAGNOSIS — M79672 Pain in left foot: Secondary | ICD-10-CM | POA: Diagnosis not present

## 2017-03-16 DIAGNOSIS — L4059 Other psoriatic arthropathy: Secondary | ICD-10-CM | POA: Diagnosis not present

## 2017-03-16 DIAGNOSIS — L409 Psoriasis, unspecified: Secondary | ICD-10-CM | POA: Diagnosis not present

## 2017-04-04 ENCOUNTER — Ambulatory Visit: Payer: Federal, State, Local not specified - PPO

## 2017-04-07 NOTE — Progress Notes (Signed)
Please place orders in EPIC with informed consent  As patient has a pre-op appointment on 04/13/2017! Thank you!

## 2017-04-10 NOTE — Progress Notes (Signed)
Please place orders for informed consent as patient has a pre-op appointment on 04/13/2017! Thank you!

## 2017-04-11 NOTE — Progress Notes (Signed)
Please place consent form in EPIC as patient has a pre-op appointment on 04/13/2017! Thank you!

## 2017-04-12 NOTE — Patient Instructions (Addendum)
Norabelle Tritch  04/12/2017   Your procedure is scheduled on: 04/18/17  Report to [facility] Main  Entrance Take [place]  elevators to 3rd floor to  Short Stay Center at     1145    Call this number if you have problems the morning of surgery [phone]    Remember: ONLY 1 PERSON MAY GO WITH YOU TO SHORT STAY TO GET  READY MORNING OF YOUR SURGERY.  Do not eat food after midnite.  May have clear liquids from 12 midnite until 0700am morning of surgery then nothing by mouth.    CLEAR LIQUID DIET   Foods Allowed                                                                     Foods Excluded  Coffee and tea, regular and decaf                             liquids that you cannot  Plain Jell-O in any flavor                                             see through such as: Fruit ices (not with fruit pulp)                                     milk, soups, orange juice  Iced Popsicles                                    All solid food Carbonated beverages, regular and diet                                    Cranberry, grape and apple juices Sports drinks like Gatorade Lightly seasoned clear broth or consume(fat free) Sugar, honey syrup  Sample Menu Breakfast                                Lunch                                     Supper Cranberry juice                    Beef broth                            Chicken broth Jell-O                                     Grape juice  Apple juice Coffee or tea                        Jell-O                                      Popsicle                                                Coffee or tea                        Coffee or tea  _____________________________________________________________________       Take these medicines the morning of surgery with A SIP OF WATER:                                 You may not have any metal on your body including hair pins and              piercings   Do not wear jewelry, make-up, lotions, powders or perfumes, deodorant             Do not wear nail polish.  Do not shave  48 hours prior to surgery.              Do not bring valuables to the hospital. [facility] IS NOT             RESPONSIBLE   FOR VALUABLES.  Contacts, dentures or bridgework may not be worn into surgery.  Leave suitcase in the car. After surgery it may be brought to your room.                      Please read over the following fact sheets you were given: _____________________________________________________________________             [facility] - Preparing for Surgery Before surgery, you can play an important role.  Because skin is not sterile, your skin needs to be as free of germs as possible.  You can reduce the number of germs on your skin by washing with CHG (chlorahexidine gluconate) soap before surgery.  CHG is an antiseptic cleaner which kills germs and bonds with the skin to continue killing germs even after washing. Please DO NOT use if you have an allergy to CHG or antibacterial soaps.  If your skin becomes reddened/irritated stop using the CHG and inform your nurse when you arrive at Short Stay. Do not shave (including legs and underarms) for at least 48 hours prior to the first CHG shower.  You may shave your face/neck. Please follow these instructions carefully:  1.  Shower with CHG Soap the night before surgery and the  morning of Surgery.  2.  If you choose to wash your hair, wash your hair first as usual with your  normal  shampoo.  3.  After you shampoo, rinse your hair and body thoroughly to remove the  shampoo.                           4.  Use CHG as you would any other liquid soap.  You can apply chg directly  to the skin and wash                       Gently with a scrungie or clean washcloth.  5.  Apply the CHG Soap to your body ONLY FROM THE NECK DOWN.   Do not use on face/ open                           Wound or open sores. Avoid contact  with eyes, ears mouth and genitals (private parts).                       Wash face,  Genitals (private parts) with your normal soap.             6.  Wash thoroughly, paying special attention to the area where your surgery  will be performed.  7.  Thoroughly rinse your body with warm water from the neck down.  8.  DO NOT shower/wash with your normal soap after using and rinsing off  the CHG Soap.                9.  Pat yourself dry with a clean towel.            10.  Wear clean pajamas.            11.  Place clean sheets on your bed the night of your first shower and do not  sleep with pets. Day of Surgery : Do not apply any lotions/deodorants the morning of surgery.  Please wear clean clothes to the hospital/surgery center.  FAILURE TO FOLLOW THESE INSTRUCTIONS MAY RESULT IN THE CANCELLATION OF YOUR SURGERY PATIENT SIGNATURE_________________________________  NURSE SIGNATURE__________________________________  ________________________________________________________________________

## 2017-04-12 NOTE — Progress Notes (Signed)
Please place consent form in orders in [facility] for patient as she has a pre-op appointment on 04/13/2017! Thank you!

## 2017-04-12 NOTE — Progress Notes (Signed)
No consent order in epic for surgery on 04/18/17.  Thank You.

## 2017-04-13 ENCOUNTER — Encounter (HOSPITAL_COMMUNITY)
Admission: RE | Admit: 2017-04-13 | Discharge: 2017-04-13 | Disposition: A | Discharge status: Home / Self Care | Payer: Federal, State, Local not specified - PPO | Source: Ambulatory Visit | Attending: Surgery | Admitting: Surgery

## 2017-04-13 ENCOUNTER — Encounter (HOSPITAL_COMMUNITY): Payer: Self-pay

## 2017-04-13 DIAGNOSIS — Z01818 Encounter for other preprocedural examination: Secondary | ICD-10-CM | POA: Diagnosis not present

## 2017-04-13 LAB — COMPREHENSIVE METABOLIC PANEL
ALT: 15 U/L (ref 14–54)
AST: 18 U/L (ref 15–41)
Albumin: 3.7 g/dL (ref 3.5–5.0)
Alkaline Phosphatase: 49 U/L (ref 38–126)
Anion gap: 9 (ref 5–15)
BUN: 16 mg/dL (ref 6–20)
CO2: 24 mmol/L (ref 22–32)
Calcium: 9.1 mg/dL (ref 8.9–10.3)
Chloride: 104 mmol/L (ref 101–111)
Creatinine, Ser: 0.74 mg/dL (ref 0.44–1.00)
GFR calc Af Amer: >60 mL/min (ref >60)
GFR calc non Af Amer: >60 mL/min (ref >60)
Glucose, Bld: 106 mg/dL — ABNORMAL HIGH (ref 65–99)
Potassium: 3.7 mmol/L (ref 3.5–5.1)
Sodium: 137 mmol/L (ref 135–145)
Total Bilirubin: 0.3 mg/dL (ref 0.3–1.2)
Total Protein: 7.5 g/dL (ref 6.5–8.1)

## 2017-04-13 LAB — CBC WITH DIFFERENTIAL/PLATELET
BASOS ABS: 0 10*3/uL (ref 0.0–0.1)
BASOS PCT: 0 %
EOS PCT: 1 %
Eosinophils Absolute: 0.1 10*3/uL (ref 0.0–0.7)
HCT: 38.6 % (ref 36.0–46.0)
Hemoglobin: 12.8 g/dL (ref 12.0–15.0)
LYMPHS PCT: 34 %
Lymphs Abs: 3.3 10*3/uL (ref 0.7–4.0)
MCH: 26.9 pg (ref 26.0–34.0)
MCHC: 33.2 g/dL (ref 30.0–36.0)
MCV: 81.1 fL (ref 78.0–100.0)
MONO ABS: 0.6 10*3/uL (ref 0.1–1.0)
Monocytes Relative: 6 %
NEUTROS ABS: 5.8 10*3/uL (ref 1.7–7.7)
Neutrophils Relative %: 59 %
PLATELETS: 279 10*3/uL (ref 150–400)
RBC: 4.76 MIL/uL (ref 3.87–5.11)
RDW: 15.8 % — AB (ref 11.5–15.5)
WBC: 9.8 10*3/uL (ref 4.0–10.5)

## 2017-04-13 NOTE — Progress Notes (Signed)
EKG and CXR done 10/25/16-epic

## 2017-04-13 NOTE — Progress Notes (Signed)
DR Fredricka Bonine I ordered a CBC/DIFF/CMP on preop appointment of 04/13/2017 since no preop lab orders in epic.  Still need consent form order in epic.  Surgery on 04/18/17.  Thank You.

## 2017-04-17 NOTE — Progress Notes (Signed)
Patient made aware of time change.  Patient aware to be here at 0515am with surgery time of 0715am.

## 2017-04-18 ENCOUNTER — Encounter (HOSPITAL_COMMUNITY): Payer: Self-pay | Admitting: *Deleted

## 2017-04-18 ENCOUNTER — Inpatient Hospital Stay (HOSPITAL_COMMUNITY): Payer: Federal, State, Local not specified - PPO | Admitting: Certified Registered"

## 2017-04-18 ENCOUNTER — Encounter (HOSPITAL_COMMUNITY)
Admission: RE | Disposition: A | Discharge status: Home / Self Care | Payer: Self-pay | Source: Ambulatory Visit | Attending: Surgery

## 2017-04-18 ENCOUNTER — Inpatient Hospital Stay (HOSPITAL_COMMUNITY)
Admission: RE | Admit: 2017-04-18 | Discharge: 2017-04-19 | DRG: 621 | Disposition: A | Discharge status: Home / Self Care | Payer: Federal, State, Local not specified - PPO | Source: Ambulatory Visit | Attending: Surgery | Admitting: Surgery

## 2017-04-18 DIAGNOSIS — J45909 Unspecified asthma, uncomplicated: Secondary | ICD-10-CM | POA: Diagnosis not present

## 2017-04-18 DIAGNOSIS — M1990 Unspecified osteoarthritis, unspecified site: Secondary | ICD-10-CM | POA: Diagnosis present

## 2017-04-18 DIAGNOSIS — K219 Gastro-esophageal reflux disease without esophagitis: Secondary | ICD-10-CM | POA: Diagnosis not present

## 2017-04-18 DIAGNOSIS — Z6841 Body Mass Index (BMI) 40.0 and over, adult: Secondary | ICD-10-CM

## 2017-04-18 HISTORY — PX: LAPAROSCOPIC GASTRIC SLEEVE RESECTION: SHX5895

## 2017-04-18 LAB — PREGNANCY, URINE: PREG TEST UR: NEGATIVE

## 2017-04-18 SURGERY — GASTRECTOMY, SLEEVE, LAPAROSCOPIC
Anesthesia: General

## 2017-04-18 MED ORDER — HYDROMORPHONE HCL-NACL 0.5-0.9 MG/ML-% IV SOSY
0.2500 mg | PREFILLED_SYRINGE | INTRAVENOUS | Status: DC | PRN
  Administered 2017-04-18 (×4): 0.5 mg via INTRAVENOUS

## 2017-04-18 MED ORDER — LIDOCAINE 2% (20 MG/ML) 5 ML SYRINGE
INTRAMUSCULAR | Status: DC | PRN
  Administered 2017-04-18: 60 mg via INTRAVENOUS

## 2017-04-18 MED ORDER — GABAPENTIN 250 MG/5ML PO SOLN
200.0000 mg | Freq: Two times a day (BID) | ORAL | Status: DC
  Administered 2017-04-18: 200 mg via ORAL
  Filled 2017-04-18 (×3): qty 4

## 2017-04-18 MED ORDER — BUPIVACAINE-EPINEPHRINE (PF) 0.25% -1:200000 IJ SOLN
INTRAMUSCULAR | Status: AC
  Filled 2017-04-18: qty 30

## 2017-04-18 MED ORDER — 0.9 % SODIUM CHLORIDE (POUR BTL) OPTIME
TOPICAL | Status: DC | PRN
  Administered 2017-04-18: 1000 mL

## 2017-04-18 MED ORDER — CHLORHEXIDINE GLUCONATE 4 % EX LIQD: 60.0000 mL | Freq: Once | CUTANEOUS | Status: DC

## 2017-04-18 MED ORDER — PREMIER PROTEIN SHAKE
2.0000 [oz_av] | ORAL | Status: DC
  Administered 2017-04-19 (×2): 2 [oz_av] via ORAL

## 2017-04-18 MED ORDER — STERILE WATER FOR IRRIGATION IR SOLN
Status: DC | PRN
  Administered 2017-04-18: 2000 mL

## 2017-04-18 MED ORDER — PROPOFOL 10 MG/ML IV BOLUS
INTRAVENOUS | Status: AC
  Filled 2017-04-18: qty 20

## 2017-04-18 MED ORDER — ONDANSETRON HCL 4 MG/2ML IJ SOLN
INTRAMUSCULAR | Status: DC | PRN
  Administered 2017-04-18: 4 mg via INTRAVENOUS

## 2017-04-18 MED ORDER — LACTATED RINGERS IV SOLN
INTRAVENOUS | Status: DC | PRN
  Administered 2017-04-18 (×2): via INTRAVENOUS

## 2017-04-18 MED ORDER — ENOXAPARIN SODIUM 40 MG/0.4ML ~~LOC~~ SOLN
40.0000 mg | SUBCUTANEOUS | Status: AC
  Administered 2017-04-18: 40 mg via SUBCUTANEOUS
  Filled 2017-04-18: qty 0.4

## 2017-04-18 MED ORDER — FENTANYL CITRATE (PF) 250 MCG/5ML IJ SOLN
INTRAMUSCULAR | Status: AC
  Filled 2017-04-18: qty 5

## 2017-04-18 MED ORDER — ENOXAPARIN SODIUM 30 MG/0.3ML ~~LOC~~ SOLN
30.0000 mg | Freq: Two times a day (BID) | SUBCUTANEOUS | Status: DC
  Administered 2017-04-19: 30 mg via SUBCUTANEOUS
  Filled 2017-04-18: qty 0.3

## 2017-04-18 MED ORDER — SODIUM CHLORIDE 0.9 % IV SOLN
INTRAVENOUS | Status: DC
Start: 2017-04-18 — End: 2017-04-19
  Administered 2017-04-18 – 2017-04-19 (×3): via INTRAVENOUS

## 2017-04-18 MED ORDER — ALBUTEROL SULFATE (2.5 MG/3ML) 0.083% IN NEBU: 2.5000 mg | INHALATION_SOLUTION | Freq: Four times a day (QID) | RESPIRATORY_TRACT | Status: DC | PRN

## 2017-04-18 MED ORDER — BUPIVACAINE-EPINEPHRINE 0.25% -1:200000 IJ SOLN
INTRAMUSCULAR | Status: DC | PRN
  Administered 2017-04-18: 30 mL

## 2017-04-18 MED ORDER — HYDROMORPHONE HCL-NACL 0.5-0.9 MG/ML-% IV SOSY: 0.2500 mg | PREFILLED_SYRINGE | INTRAVENOUS | Status: DC | PRN

## 2017-04-18 MED ORDER — APREPITANT 40 MG PO CAPS
40.0000 mg | ORAL_CAPSULE | ORAL | Status: AC
  Administered 2017-04-18: 40 mg via ORAL
  Filled 2017-04-18: qty 1

## 2017-04-18 MED ORDER — MIDAZOLAM HCL 2 MG/2ML IJ SOLN
INTRAMUSCULAR | Status: AC
  Filled 2017-04-18: qty 2

## 2017-04-18 MED ORDER — CEFOTETAN DISODIUM-DEXTROSE 2-2.08 GM-%(50ML) IV SOLR
2.0000 g | INTRAVENOUS | Status: AC
  Administered 2017-04-18: 2 g via INTRAVENOUS

## 2017-04-18 MED ORDER — PROPOFOL 10 MG/ML IV BOLUS
INTRAVENOUS | Status: DC | PRN
  Administered 2017-04-18: 200 mg via INTRAVENOUS

## 2017-04-18 MED ORDER — SIMETHICONE 80 MG PO CHEW
80.0000 mg | CHEWABLE_TABLET | Freq: Four times a day (QID) | ORAL | Status: DC | PRN
  Administered 2017-04-18 – 2017-04-19 (×2): 80 mg via ORAL
  Filled 2017-04-18 (×2): qty 1

## 2017-04-18 MED ORDER — HYDROMORPHONE HCL-NACL 0.5-0.9 MG/ML-% IV SOSY
PREFILLED_SYRINGE | INTRAVENOUS | Status: AC
  Administered 2017-04-18: 0.5 mg via INTRAVENOUS
  Filled 2017-04-18: qty 1

## 2017-04-18 MED ORDER — HYDRALAZINE HCL 20 MG/ML IJ SOLN: 10.0000 mg | INTRAMUSCULAR | Status: DC | PRN

## 2017-04-18 MED ORDER — CELECOXIB 200 MG PO CAPS
400.0000 mg | ORAL_CAPSULE | ORAL | Status: AC
  Administered 2017-04-18: 400 mg via ORAL
  Filled 2017-04-18: qty 2

## 2017-04-18 MED ORDER — SCOPOLAMINE 1 MG/3DAYS TD PT72
1.0000 | MEDICATED_PATCH | TRANSDERMAL | Status: DC
  Administered 2017-04-18: 1.5 mg via TRANSDERMAL
  Filled 2017-04-18: qty 1

## 2017-04-18 MED ORDER — INFLUENZA VAC SPLIT QUAD 0.5 ML IM SUSY: 0.5000 mL | PREFILLED_SYRINGE | INTRAMUSCULAR | Status: DC

## 2017-04-18 MED ORDER — LABETALOL HCL 5 MG/ML IV SOLN
INTRAVENOUS | Status: AC
  Administered 2017-04-18: 10 mg via INTRAVENOUS
  Filled 2017-04-18: qty 4

## 2017-04-18 MED ORDER — ACETAMINOPHEN 500 MG PO TABS
1000.0000 mg | ORAL_TABLET | ORAL | Status: AC
  Administered 2017-04-18: 1000 mg via ORAL
  Filled 2017-04-18: qty 2

## 2017-04-18 MED ORDER — CEFOTETAN DISODIUM-DEXTROSE 2-2.08 GM-%(50ML) IV SOLR
INTRAVENOUS | Status: AC
  Filled 2017-04-18: qty 50

## 2017-04-18 MED ORDER — KETAMINE HCL 10 MG/ML IJ SOLN
INTRAMUSCULAR | Status: DC | PRN
  Administered 2017-04-18 (×3): 20 mg via INTRAVENOUS

## 2017-04-18 MED ORDER — LABETALOL HCL 5 MG/ML IV SOLN
10.0000 mg | INTRAVENOUS | Status: DC | PRN
  Administered 2017-04-18 (×2): 10 mg via INTRAVENOUS

## 2017-04-18 MED ORDER — PANTOPRAZOLE SODIUM 40 MG IV SOLR
40.0000 mg | Freq: Every day | INTRAVENOUS | Status: DC
  Administered 2017-04-18: 40 mg via INTRAVENOUS
  Filled 2017-04-18: qty 40

## 2017-04-18 MED ORDER — MIDAZOLAM HCL 2 MG/2ML IJ SOLN
INTRAMUSCULAR | Status: DC | PRN
  Administered 2017-04-18: 2 mg via INTRAVENOUS

## 2017-04-18 MED ORDER — ONDANSETRON HCL 4 MG/2ML IJ SOLN
4.0000 mg | INTRAMUSCULAR | Status: DC | PRN
  Administered 2017-04-19: 4 mg via INTRAVENOUS
  Filled 2017-04-18: qty 2

## 2017-04-18 MED ORDER — LIDOCAINE 2% (20 MG/ML) 5 ML SYRINGE
INTRAMUSCULAR | Status: DC | PRN
  Administered 2017-04-18: 1.5 mg/kg/h via INTRAVENOUS

## 2017-04-18 MED ORDER — DEXAMETHASONE SODIUM PHOSPHATE 4 MG/ML IJ SOLN
4.0000 mg | INTRAMUSCULAR | Status: AC
  Administered 2017-04-18: 4 mg via INTRAVENOUS

## 2017-04-18 MED ORDER — ACETAMINOPHEN 160 MG/5ML PO SOLN: 650.0000 mg | ORAL | Status: DC | PRN

## 2017-04-18 MED ORDER — OXYCODONE HCL 5 MG PO TABS: 5.0000 mg | ORAL_TABLET | Freq: Once | ORAL | Status: DC | PRN

## 2017-04-18 MED ORDER — HYDROMORPHONE HCL-NACL 0.5-0.9 MG/ML-% IV SOSY
PREFILLED_SYRINGE | INTRAVENOUS | Status: AC
  Administered 2017-04-18: 0.5 mg via INTRAVENOUS
  Filled 2017-04-18: qty 3

## 2017-04-18 MED ORDER — GABAPENTIN 300 MG PO CAPS
300.0000 mg | ORAL_CAPSULE | ORAL | Status: AC
  Administered 2017-04-18: 300 mg via ORAL
  Filled 2017-04-18: qty 1

## 2017-04-18 MED ORDER — KETAMINE HCL-SODIUM CHLORIDE 100-0.9 MG/10ML-% IV SOSY
PREFILLED_SYRINGE | INTRAVENOUS | Status: AC
  Filled 2017-04-18: qty 10

## 2017-04-18 MED ORDER — FENTANYL CITRATE (PF) 250 MCG/5ML IJ SOLN
INTRAMUSCULAR | Status: DC | PRN
  Administered 2017-04-18: 50 ug via INTRAVENOUS
  Administered 2017-04-18 (×2): 100 ug via INTRAVENOUS
  Administered 2017-04-18: 50 ug via INTRAVENOUS

## 2017-04-18 MED ORDER — HYDROMORPHONE HCL-NACL 0.5-0.9 MG/ML-% IV SOSY
PREFILLED_SYRINGE | INTRAVENOUS | Status: AC
  Filled 2017-04-18: qty 2

## 2017-04-18 MED ORDER — SUGAMMADEX SODIUM 200 MG/2ML IV SOLN
INTRAVENOUS | Status: DC | PRN
  Administered 2017-04-18: 300 mg via INTRAVENOUS

## 2017-04-18 MED ORDER — LACTATED RINGERS IR SOLN
Status: DC | PRN
  Administered 2017-04-18: 1000 mL

## 2017-04-18 MED ORDER — OXYCODONE HCL 5 MG/5ML PO SOLN
5.0000 mg | Freq: Once | ORAL | Status: DC | PRN
  Filled 2017-04-18: qty 5

## 2017-04-18 MED ORDER — ROCURONIUM BROMIDE 10 MG/ML (PF) SYRINGE
PREFILLED_SYRINGE | INTRAVENOUS | Status: DC | PRN
  Administered 2017-04-18: 10 mg via INTRAVENOUS
  Administered 2017-04-18 (×2): 5 mg via INTRAVENOUS
  Administered 2017-04-18: 30 mg via INTRAVENOUS

## 2017-04-18 MED ORDER — OXYCODONE HCL 5 MG/5ML PO SOLN
5.0000 mg | ORAL | Status: DC | PRN
  Administered 2017-04-18: 10 mg via ORAL
  Administered 2017-04-19: 5 mg via ORAL
  Administered 2017-04-19: 10 mg via ORAL
  Administered 2017-04-19: 5 mg via ORAL
  Filled 2017-04-18: qty 5
  Filled 2017-04-18: qty 10
  Filled 2017-04-18 (×2): qty 5
  Filled 2017-04-18: qty 10

## 2017-04-18 MED ORDER — BUPIVACAINE LIPOSOME 1.3 % IJ SUSP
20.0000 mL | Freq: Once | INTRAMUSCULAR | Status: AC
  Administered 2017-04-18: 20 mL
  Filled 2017-04-18: qty 20

## 2017-04-18 MED ORDER — HYDROMORPHONE HCL 1 MG/ML IJ SOLN
0.5000 mg | INTRAMUSCULAR | Status: DC | PRN
  Administered 2017-04-18: 0.5 mg via INTRAVENOUS
  Filled 2017-04-18: qty 1

## 2017-04-18 MED ORDER — METOPROLOL TARTRATE 5 MG/5ML IV SOLN: 5.0000 mg | Freq: Four times a day (QID) | INTRAVENOUS | Status: DC | PRN

## 2017-04-18 MED ORDER — ONDANSETRON HCL 4 MG/2ML IJ SOLN: 4.0000 mg | Freq: Four times a day (QID) | INTRAMUSCULAR | Status: DC | PRN

## 2017-04-18 MED ORDER — SUCCINYLCHOLINE CHLORIDE 200 MG/10ML IV SOSY
PREFILLED_SYRINGE | INTRAVENOUS | Status: DC | PRN
  Administered 2017-04-18: 120 mg via INTRAVENOUS

## 2017-04-18 SURGICAL SUPPLY — 59 items
APPLICATOR COTTON TIP 6IN STRL (MISCELLANEOUS) IMPLANT
APPLIER CLIP ROT 10 11.4 M/L (STAPLE)
APPLIER CLIP ROT 13.4 12 LRG (CLIP) ×2
BANDAGE ADH SHEER 1  50/CT (GAUZE/BANDAGES/DRESSINGS) ×12 IMPLANT
BENZOIN TINCTURE PRP APPL 2/3 (GAUZE/BANDAGES/DRESSINGS) ×2 IMPLANT
BLADE SURG SZ11 CARB STEEL (BLADE) ×2 IMPLANT
CABLE HIGH FREQUENCY MONO STRZ (ELECTRODE) ×2 IMPLANT
CHLORAPREP W/TINT 26ML (MISCELLANEOUS) ×4 IMPLANT
CLIP APPLIE ROT 10 11.4 M/L (STAPLE) IMPLANT
CLIP APPLIE ROT 13.4 12 LRG (CLIP) ×1 IMPLANT
COVER SURGICAL LIGHT HANDLE (MISCELLANEOUS) ×2 IMPLANT
DECANTER SPIKE VIAL GLASS SM (MISCELLANEOUS) ×2 IMPLANT
DEVICE SUT QUICK LOAD TK 5 (STAPLE) IMPLANT
DEVICE SUT TI-KNOT TK 5X26 (MISCELLANEOUS) IMPLANT
DRAPE UTILITY XL STRL (DRAPES) ×4 IMPLANT
ELECT REM PT RETURN 15FT ADLT (MISCELLANEOUS) ×2 IMPLANT
GAUZE SPONGE 4X4 12PLY STRL (GAUZE/BANDAGES/DRESSINGS) IMPLANT
GLOVE BIO SURGEON STRL SZ 6 (GLOVE) ×2 IMPLANT
GLOVE INDICATOR 6.5 STRL GRN (GLOVE) ×2 IMPLANT
GOWN STRL REUS W/TWL LRG LVL3 (GOWN DISPOSABLE) ×2 IMPLANT
GOWN STRL REUS W/TWL XL LVL3 (GOWN DISPOSABLE) ×4 IMPLANT
GRASPER SUT TROCAR 14GX15 (MISCELLANEOUS) ×2 IMPLANT
HOVERMATT SINGLE USE (MISCELLANEOUS) ×2 IMPLANT
KIT BASIN OR (CUSTOM PROCEDURE TRAY) ×2 IMPLANT
MARKER SKIN DUAL TIP RULER LAB (MISCELLANEOUS) ×2 IMPLANT
NEEDLE SPNL 22GX3.5 QUINCKE BK (NEEDLE) ×2 IMPLANT
PACK UNIVERSAL I (CUSTOM PROCEDURE TRAY) ×2 IMPLANT
RELOAD ENDO STITCH (ENDOMECHANICALS) IMPLANT
RELOAD STAPLER BLUE 60MM (STAPLE) ×3 IMPLANT
RELOAD STAPLER GOLD 60MM (STAPLE) ×2 IMPLANT
RELOAD STAPLER GREEN 60MM (STAPLE) ×1 IMPLANT
SCISSORS LAP 5X45 EPIX DISP (ENDOMECHANICALS) ×2 IMPLANT
SET IRRIG TUBING LAPAROSCOPIC (IRRIGATION / IRRIGATOR) ×2 IMPLANT
SHEARS HARMONIC ACE PLUS 45CM (MISCELLANEOUS) ×2 IMPLANT
SLEEVE ADV FIXATION 5X100MM (TROCAR) ×4 IMPLANT
SLEEVE GASTRECTOMY 40FR VISIGI (MISCELLANEOUS) ×2 IMPLANT
SOLUTION ANTI FOG 6CC (MISCELLANEOUS) ×2 IMPLANT
SPONGE LAP 18X18 X RAY DECT (DISPOSABLE) ×2 IMPLANT
STAPLER ECHELON BIOABSB 60 FLE (MISCELLANEOUS) ×10 IMPLANT
STAPLER ECHELON LONG 60 440 (INSTRUMENTS) ×2 IMPLANT
STAPLER RELOAD BLUE 60MM (STAPLE) ×6
STAPLER RELOAD GOLD 60MM (STAPLE) ×4
STAPLER RELOAD GREEN 60MM (STAPLE) ×2
STRIP CLOSURE SKIN 1/2X4 (GAUZE/BANDAGES/DRESSINGS) ×2 IMPLANT
SUT MNCRL AB 4-0 PS2 18 (SUTURE) ×2 IMPLANT
SUT SURGIDAC NAB ES-9 0 48 120 (SUTURE) IMPLANT
SUT VICRYL 0 TIES 12 18 (SUTURE) ×2 IMPLANT
SYR 10ML ECCENTRIC (SYRINGE) ×2 IMPLANT
SYR 20CC LL (SYRINGE) ×2 IMPLANT
SYR 50ML LL SCALE MARK (SYRINGE) ×2 IMPLANT
TOWEL OR 17X26 10 PK STRL BLUE (TOWEL DISPOSABLE) ×2 IMPLANT
TOWEL OR NON WOVEN STRL DISP B (DISPOSABLE) ×2 IMPLANT
TROCAR ADV FIXATION 5X100MM (TROCAR) ×2 IMPLANT
TROCAR BLADELESS 15MM (ENDOMECHANICALS) ×2 IMPLANT
TROCAR BLADELESS OPT 5 100 (ENDOMECHANICALS) ×2 IMPLANT
TUBING CONNECTING 10 (TUBING) ×4 IMPLANT
TUBING ENDO SMARTCAP (MISCELLANEOUS) ×2 IMPLANT
TUBING INSUF HEATED (TUBING) ×2 IMPLANT
VALVE SET DISP 3 PC AWS (VALVE) ×2 IMPLANT

## 2017-04-18 NOTE — Op Note (Signed)
Operative Note  Abigail Morales  [id number]  [id number]  04/18/2017   Surgeon: Lady Deutscher ConnorMD  Assistant: Gaynelle Adu MD  Procedure performed: laparoscopic sleeve gastrectomy, upper endoscopy  Preop diagnosis: Morbid obesity Body mass index is 40.54 kg/m., GERD, asthma, arthritis Post-op diagnosis/intraop findings: same  Specimens: fundus Retained items: none EBL: 30cc Complications: none  Description of procedure: After obtaining informed consent and administration of prophylactic lovenox in holding, the patient was taken to the operating room and placed supine on operating room table wheregeneral endotracheal anesthesia was initiated, preoperative antibiotics were administered, SCDs applied, and a formal timeout was performed. The abdomen was prepped and draped in usual sterile fashion. Peritoneal access was gained using a Visiport technique in the left upper quadrant and insufflation to 15 mmHg ensued without issue. Gross inspection revealed no evidence of injury. Under direct visualization three more 5 mm trochars were placed in the right and left hemiabdomen and the 15mm trocar in the right paramedian upper abdomen. Bilateral laparoscopic assisted TAPS blocks were performed with Exparel diluted with 0.25 percent Marcaine with epinephrine. The patient was placed in steep Trendelenburg and the liver retractor was introduced through an incision in the upper midline and secured to the post externally to maintain the left lobe retracted anteriorly. Using the Harmonic scalpel, the greater curvature of the stomach was dissected away from the greater omentum and short gastric vessels were divided. This began 6 cm from the pylorus, and dissection proceeded until the left crus was clearly exposed. Esophageal fat pad was mobilized off the anterior stomach slightly. The 21 [place] VisiGi was then introduced and directed down towards the pylorus. This was placed to suction against the lesser  curve. Serial fires of the linear cutting stapler with Peri-Strips were then employed to create our sleeve. The first fire used to green load and ensured adequate room at the angularis incisura. One gold load and then 3 blue loads were then employed to create a narrow tubular stomach all up to the angle of His. The excised stomach was then removed through our 15 mm trocar site within an Endo Catch bag. The visigi was taken off of suction and a few puffs of air were introduced, inflating the sleeve. No bubbles were observed and the irrigation fluid around the stomach and the shape was noted to be a nice smooth tube without any narrowing at the angularis. The visigi was then removed. Upper endoscopy was performed by the assistant surgeon and the sleeve was noted to be airtight, widely patent at the angularis and the staple line was hemostatic. Please see his separate note. The endoscope was removed. There was a very small amount of bleeding along the distal staple line externally which was controlled with clips. The 15 mm trocar site fascia in the right upper abdomen was closed with 2 interrupted sutures of 0 Vicryl using the laparoscopic suture passer under direct visualization. The abdomen was once again inspected and confirmed to be free of injury or bleeding. The liver retractor was removed under direct visualization. The abdomen was then desufflated and all remaining trochars removed. The remaining local was infiltrated around each incision. The skin incisions were closed with running subcuticular Monocryl; benzoin, Steri-Strips and Band-Aids were applied The patient was then awakened, extubated and taken to PACU in stable condition.    All counts were correct at the completion of the case.

## 2017-04-18 NOTE — Op Note (Signed)
Abigail Morales [id number] 1985-11-15 04/18/2017  Preoperative diagnosis: morbid obesity  Postoperative diagnosis: Same   Procedure: upper endoscopy   Surgeon: Mary Sella. Briza Bark M.D., FACS   Anesthesia: Gen.   Indications for procedure: 31 y.o. year old female undergoing Laparoscopic Gastric Sleeve Resection and an EGD was requested to evaluate the new gastric sleeve.   Description of procedure: After we have completed the sleeve resection, I scrubbed out and obtained the Olympus endoscope. I gently placed endoscope in the patient's oropharynx and gently glided it down the esophagus without any difficulty under direct visualization. Once I was in the gastric sleeve, I insufflated the stomach with air. I was able to cannulate and advanced the scope through the gastric sleeve. I was able to cannulate the duodenum with ease. Dr. Fredricka Bonine had placed saline in the upper abdomen. Upon further insufflation of the gastric sleeve there was no evidence of bubbles. GE junction located at 38 cm.  Upon further inspection of the gastric sleeve, the mucosa appeared normal. There is no evidence of any mucosal abnormality. The sleeve was widely patent at the angularis. There was no evidence of bleeding. The gastric sleeve was decompressed. The scope was withdrawn. The patient tolerated this portion of the procedure well. Please see Dr Derrill Memo operative note for details regarding the laparoscopic gastric sleeve resection.   Mary Sella. Andrey Campanile, MD, FACS  General, Bariatric, & Minimally Invasive Surgery  [facility], [place]

## 2017-04-18 NOTE — Progress Notes (Signed)
Post op day progression discussed with patient including ambulation, IS, diet progression, pain and nausea control.  Patient only question regarding discharge appointments.  Information to be provided at discharge.  Patient states understanding.  Family at bedside.

## 2017-04-18 NOTE — Anesthesia Procedure Notes (Signed)
Procedure Name: Intubation Date/Time: 04/18/2017 7:24 AM Performed by: Minerva Ends Pre-anesthesia Checklist: Patient identified, Emergency Drugs available, Suction available and Patient being monitored Patient Re-evaluated:Patient Re-evaluated prior to induction Oxygen Delivery Method: Circle System Utilized Preoxygenation: Pre-oxygenation with 100% oxygen Induction Type: IV induction Ventilation: Mask ventilation without difficulty Laryngoscope Size: Miller and 2 Grade View: Grade I Tube type: Oral Number of attempts: 1 Airway Equipment and Method: Stylet Placement Confirmation: ETT inserted through vocal cords under direct vision,  positive ETCO2 and breath sounds checked- equal and bilateral Secured at: 22 cm Tube secured with: Tape Dental Injury: Teeth and Oropharynx as per pre-operative assessment  Comments: Smooth Iv induction [place]-- intubation AM CRNA atraumatic-- teeth and mouth as preop-- irregular surfaces on front teeth prior to laryngoscopy-- bilat BS [place]

## 2017-04-18 NOTE — H&P (Signed)
[place] Patient #: [id number] DOB: 02/08/1986 Married / Language: English / Race: White Female   History of Present Illness Patient words: She works as a Radio broadcast assistant She is married, and she and her husband hope to start their family in 2 years or so. No tobacco, alcohol or drug use. Recently diagnosed with psoriatic arthritis (?). Borderline htn but has not required meds Has reflux, takes antacids daily Mom has diabetes, mult family members with hypertension.  The patient is a 31 year old female who presents for a bariatric surgery evaluation. Associated symptoms include poor self esteem, joint pains and heartburn. Initial onset of obesity was in childhood. Patient's highest weight was 245 pounds. Patient's lowest weight in adulthood was 180 pounds (in high school. has gotten down to 220 with crossfit.). Disease complications include osteoarthritis (psoriatic arthritis). Current diet includes well balanced meals. a daily and walking. Pertinent family history includes obesity, diabetes and family member has had bariatric surgery (mom had a rygb). The patient is currently able to do activities of daily living without limitations, able to work without limitations, able to do housework without limitations and able to participate in sports without limitations. Past evaluation has included sleep study (5 years ago, negative). Past treatment has included low calorie diet and exercise regimen (cross fit). Psychiatric History: The patient denies depression, bipolar disorder, anxiety or psychiatric hospitalizations. Cardiac history: The patient denies history of angina, history of stroke, pulmonary embolus, sleep apnea or smoking. Gastrointestinal History: Patient has heartburn, and denies dysphagia or irritable bowel disease. MBSQIP recognized comorbidities: Patient has gastroesophageal reflux disease. She has completed the preoperative workup and no barriers have been identified.   Since we first  met she has undergone left knee surgery for a meniscal tear and states there was debris in her synovial fluid. She has seen a rheumatologist and completed a dose pack of prednisone a few weeks ago; she states they are considering starting her on an injectable immunosuppressant.  Psych eval: complete (Dr. Ardath Sax)- approved Nutrition eval- complete, approved UGI- negative, no hiatal hernia CXR- negative   Problem List/Past Medical  MORBID OBESITY (E66.01)   Past Surgical History  No pertinent past surgical history   Diagnostic Studies History  Colonoscopy  never Mammogram  never Pap Smear  1-5 years ago  Allergies  Robitussin 12 Hour Cough *COUGH/COLD/ALLERGY*  Allergies Reconciled   Medication History  Microgestin 1/20 (1-'20MG'$ -MCG Tablet, Oral) Active. Adderall ('10MG'$  Tablet, Oral three times daily, as needed) Active. ProAir HFA (108 (90 Base)MCG/ACT Aerosol Soln, Inhalation) Active. Zantac ('150MG'$  Tablet, Oral) Active.  Social History Alcohol use  Occasional alcohol use. Caffeine use  Carbonated beverages. No drug use  Tobacco use  Never smoker.  Family History Depression  Mother. Diabetes Mellitus  Father, Mother. Heart disease in female family member before age 61  Heart disease in female family member before age 69  Hypertension  Mother, Sister.  Pregnancy / Birth History Age at menarche  12 years. Contraceptive History  Oral contraceptives. Gravida  0 Para  0 Regular periods   Other Problems  Anxiety Disorder  Arthritis  Asthma  Back Pain  Bladder Problems  Gastroesophageal Reflux Disease     Review of Systems All other systems negative   Physical Exam General Mental Status-Alert. General Appearance-Cooperative. Orientation-Oriented X4. Build & Nutrition-Obese. Posture-Normal posture.  Integumentary Global Assessment Normal Exam - Head/Face: no rashes, ulcers, lesions or evidence of photo  damage. No palpable nodules or masses and Neck: no visible lesions or palpable masses.  Head and Neck Head-normocephalic, atraumatic with no lesions or palpable masses. Face Global Assessment - atraumatic. Thyroid Gland Characteristics - normal size and consistency.  Eye Eyeball - Bilateral-Extraocular movements intact. Sclera/Conjunctiva - Bilateral-No scleral icterus, No Discharge.  ENMT Nose and Sinuses Nose - no deformities observed, no swelling present.  Chest and Lung Exam Palpation Normal exam - Non-tender. Auscultation Breath sounds - Normal.  Cardiovascular Auscultation Rhythm - Regular. Heart Sounds - S1 WNL and S2 WNL. Carotid arteries - No Carotid bruit.  Abdomen Inspection Normal Exam - No Visible peristalsis, No Abnormal pulsations and No Paradoxical movements. Palpation/Percussion Normal exam - Soft, Non Tender, No Rebound tenderness, No Rigidity (guarding), No hepatosplenomegaly and No Palpable abdominal masses.  Peripheral Vascular Upper Extremity Palpation - Pulses bilaterally normal. Lower Extremity Palpation - Edema - Bilateral - No edema.  Neurologic Neurologic evaluation reveals -normal sensation and normal coordination.  Neuropsychiatric Mental status exam performed with findings of-able to articulate well with normal speech/language, rate, volume and coherence and thought content normal with ability to perform basic computations and apply abstract reasoning.  Musculoskeletal Normal Exam - Bilateral-Upper Extremity Strength Normal and Lower Extremity Strength Normal.    Assessment & Plan  MORBID OBESITY (E66.01) Story: She struggled with obesity for her entire life. She is essentially otherwise healthy aside from recently diagnosed psoriatic arthritis. She had been seen initially in 2014 for bariatric surgery, but at that time she elected to give diet and exercise another shot. She walks or come daily, and she was  doing cross that 6 days a week and got as low as 220 but it was terrible for her joints. She also used to run. She is interested in surgery and weight loss before starting her family. We discussed that I recommend waiting at least a year after weight loss surgery before getting pregnant. We discussed both Roux-en-Y gastric bypass and sleeve gastrectomy. We discussed the technical features of each, expected weight loss and effects on comorbidities, and risks of each and the differences between them. I discussed with her that in terms of reflux, 10% of people have worsening symptoms after sleep, 10% have improvement in the rest have no change. We discussed risks of leaks, internal hernias, infection, scarring, pain, bleeding, blood clots. stroke etc. Given her young age and lack of significant comorbidities other than arthritis, I believe she is a good candidate for sleeve gastrectomy. I advised her that it would be preferable to wait a few months after her surgery before initiating any sort of immunosuppressive therapy.

## 2017-04-18 NOTE — Progress Notes (Signed)
Dr Fredricka Bonine paged regarding BP 137/100 and heartrate 54. Lina Sar, RN

## 2017-04-18 NOTE — Transfer of Care (Signed)
Immediate Anesthesia Transfer of Care Note  Patient: Abigail Morales  Procedure(s) Performed: LAPAROSCOPIC GASTRIC SLEEVE RESECTION, UPPER ENDO (N/A )  Patient Location: PACU  Anesthesia Type:General  Level of Consciousness: sedated  Airway & Oxygen Therapy: Patient Spontanous Breathing and Patient connected to face mask oxygen  Post-op Assessment: Report given to RN and Post -op Vital signs reviewed and stable  Post vital signs: Reviewed and stable  Last Vitals:  Vitals:   04/18/17 0505  BP: 124/79  Pulse: 95  Resp: 18  Temp: 36.7 C  SpO2: 100%    Last Pain:  Vitals:   04/18/17 0520  TempSrc:   PainSc: 6       Patients Stated Pain Goal: 4 (04/18/17 0520)  Complications: No apparent anesthesia complications

## 2017-04-18 NOTE — Anesthesia Preprocedure Evaluation (Addendum)
Anesthesia Evaluation  Patient identified by MRN, date of birth, ID band Patient awake    Reviewed: Allergy & Precautions, H&P , NPO status , Patient's Chart, lab work & pertinent test results  Airway Mallampati: II   Neck ROM: full    Dental  (+) Dental Advisory Given, Chipped   Pulmonary asthma ,    breath sounds clear to auscultation       Cardiovascular negative cardio ROS   Rhythm:regular Rate:Normal     Neuro/Psych    GI/Hepatic GERD  ,  Endo/Other  Morbid obesity  Renal/GU      Musculoskeletal  (+) Arthritis ,   Abdominal   Peds  Hematology   Anesthesia Other Findings   Reproductive/Obstetrics                            Anesthesia Physical Anesthesia Plan  ASA: II  Anesthesia Plan: General   Post-op Pain Management:    Induction: Intravenous  PONV Risk Score and Plan: 3 and Ondansetron, Dexamethasone, Midazolam, Treatment may vary due to age or medical condition and Scopolamine patch - Pre-op  Airway Management Planned: Oral ETT  Additional Equipment:   Intra-op Plan:   Post-operative Plan: Extubation in OR  Informed Consent: I have reviewed the patients History and Physical, chart, labs and discussed the procedure including the risks, benefits and alternatives for the proposed anesthesia with the patient or authorized representative who has indicated his/her understanding and acceptance.     Plan Discussed with: CRNA, Anesthesiologist and Surgeon  Anesthesia Plan Comments:         Anesthesia Quick Evaluation

## 2017-04-18 NOTE — Anesthesia Procedure Notes (Signed)
Date/Time: 04/18/2017 9:13 AM Performed by: Minerva Ends Pre-anesthesia Checklist: Emergency Drugs available, Suction available and Patient being monitored Oxygen Delivery Method: Simple face mask Placement Confirmation: positive ETCO2 and breath sounds checked- equal and bilateral Dental Injury: Teeth and Oropharynx as per pre-operative assessment  Comments: Extubated to face mask-- good AW -- O2 intact to PACU

## 2017-04-19 ENCOUNTER — Encounter (HOSPITAL_COMMUNITY): Payer: Self-pay | Admitting: Surgery

## 2017-04-19 LAB — CBC WITH DIFFERENTIAL/PLATELET
BASOS ABS: 0 10*3/uL (ref 0.0–0.1)
BASOS PCT: 0 %
EOS PCT: 0 %
Eosinophils Absolute: 0 10*3/uL (ref 0.0–0.7)
HCT: 35.8 % — ABNORMAL LOW (ref 36.0–46.0)
Hemoglobin: 12.1 g/dL (ref 12.0–15.0)
LYMPHS PCT: 21 %
Lymphs Abs: 2.3 10*3/uL (ref 0.7–4.0)
MCH: 27.4 pg (ref 26.0–34.0)
MCHC: 33.8 g/dL (ref 30.0–36.0)
MCV: 81.2 fL (ref 78.0–100.0)
MONO ABS: 0.8 10*3/uL (ref 0.1–1.0)
Monocytes Relative: 8 %
Neutro Abs: 7.7 10*3/uL (ref 1.7–7.7)
Neutrophils Relative %: 71 %
PLATELETS: 232 10*3/uL (ref 150–400)
RBC: 4.41 MIL/uL (ref 3.87–5.11)
RDW: 16.2 % — AB (ref 11.5–15.5)
WBC: 10.8 10*3/uL — AB (ref 4.0–10.5)

## 2017-04-19 LAB — COMPREHENSIVE METABOLIC PANEL
ALBUMIN: 3 g/dL — AB (ref 3.5–5.0)
ALT: 22 U/L (ref 14–54)
AST: 21 U/L (ref 15–41)
Alkaline Phosphatase: 39 U/L (ref 38–126)
Anion gap: 8 (ref 5–15)
BUN: 7 mg/dL (ref 6–20)
CHLORIDE: 105 mmol/L (ref 101–111)
CO2: 24 mmol/L (ref 22–32)
CREATININE: 0.59 mg/dL (ref 0.44–1.00)
Calcium: 8 mg/dL — ABNORMAL LOW (ref 8.9–10.3)
GFR calc Af Amer: >60 mL/min (ref >60)
GLUCOSE: 102 mg/dL — AB (ref 65–99)
POTASSIUM: 3.9 mmol/L (ref 3.5–5.1)
Sodium: 137 mmol/L (ref 135–145)
Total Bilirubin: 0.4 mg/dL (ref 0.3–1.2)
Total Protein: 6.3 g/dL — ABNORMAL LOW (ref 6.5–8.1)

## 2017-04-19 MED ORDER — DOCUSATE SODIUM 100 MG PO CAPS: 100.0000 mg | ORAL_CAPSULE | Freq: Two times a day (BID) | ORAL | 0 refills | Status: AC

## 2017-04-19 MED ORDER — ONDANSETRON 4 MG PO TBDP: 4.0000 mg | ORAL_TABLET | Freq: Three times a day (TID) | ORAL | 0 refills | Status: DC | PRN

## 2017-04-19 MED ORDER — OXYCODONE HCL 5 MG/5ML PO SOLN: 5.0000 mg | Freq: Four times a day (QID) | ORAL | 0 refills | Status: DC | PRN

## 2017-04-19 MED ORDER — DOCUSATE SODIUM 100 MG PO CAPS
100.0000 mg | ORAL_CAPSULE | Freq: Two times a day (BID) | ORAL | Status: DC
  Administered 2017-04-19: 100 mg via ORAL
  Filled 2017-04-19: qty 1

## 2017-04-19 MED ORDER — ESOMEPRAZOLE MAGNESIUM 40 MG PO CPDR: 40.0000 mg | DELAYED_RELEASE_CAPSULE | Freq: Every day | ORAL | 1 refills | Status: DC

## 2017-04-19 NOTE — Anesthesia Postprocedure Evaluation (Signed)
Anesthesia Post Note  Patient: Abigail Morales  Procedure(s) Performed: LAPAROSCOPIC GASTRIC SLEEVE RESECTION, UPPER ENDO (N/A )     Patient location during evaluation: PACU Anesthesia Type: General Level of consciousness: awake and alert Pain management: pain level controlled Vital Signs Assessment: post-procedure vital signs reviewed and stable Respiratory status: spontaneous breathing, nonlabored ventilation, respiratory function stable and patient connected to nasal cannula oxygen Cardiovascular status: blood pressure returned to baseline and stable Postop Assessment: no apparent nausea or vomiting Anesthetic complications: no    Last Vitals:  Vitals:   04/19/17 0200 04/19/17 0543  BP: 112/62 127/77  Pulse: 81 75  Resp: 16 16  Temp: 36.8 C 36.7 C  SpO2: 97% 98%    Last Pain:  Vitals:   04/19/17 0543  TempSrc: Oral  PainSc:                  Neeley Sedivy S

## 2017-04-19 NOTE — Progress Notes (Signed)
S: Slept well. Pain well controlled, does have some gas pain/ shoulder pain. No nausea or dysphagia. With larger sips does feel it transiently when it hits her stomach.  Vitals, labs, intake/output, and orders reviewed at this time. Tmax 98.4. HR 54-98, currently 75. Normotensive. Sats 98 on room air. CMP and CBC unremarkable; WBC 10.8, hgb 12.1 (12.8 preop)  Gen: A&Ox3, no distress  H&N: EOMI, atraumatic, neck supple Chest: unlabored respirations, RRR Abd: soft, appropriately minimally tender, nondistended, incisions c/d/i with steris and bandaids, no ecchymosis or erythema. Ext: warm, no edema Neuro: grossly normal  Lines/tubes/drains: PIV  A/P:  POD 1 laparoscopic sleeve gastrectomy, doing well -Continue clear liquids, start protein shakes this morning -Ambulate in halls -Incentive spirometry hourly while awake  Plan discharge later today.   Phylliss Blakeshelsea Ajah Vanhoose, MD [facility], [place] Pager [phone]

## 2017-04-19 NOTE — Progress Notes (Signed)
Patient alert and oriented, pain is controlled. Patient is tolerating fluids, advanced to protein shake today, patient is tolerating well.  Reviewed Gastric sleeve discharge instructions with patient and patient is able to articulate understanding.  Provided information on BELT program, Support Group and [facility] outpatient pharmacy. All questions answered, will continue to monitor.  

## 2017-04-19 NOTE — Discharge Instructions (Signed)
° ° ° °GASTRIC BYPASS/SLEEVE ° Home Care Instructions ° ° These instructions are to help you care for yourself when you go home. ° °Call: If you have any problems. °• Call [phone] and ask for the surgeon on call °• If you need immediate assistance come to the ER at [facility]. Tell the ER staff you are a new post-op gastric bypass or gastric sleeve patient  °Signs and symptoms to report: • Severe  vomiting or nausea °o If you cannot handle clear liquids for longer than 1 day, call your surgeon °• Abdominal pain which does not get better after taking your pain medication °• Fever greater than 100.4°  F and chills °• Heart rate over 100 beats a minute °• Trouble breathing °• Chest pain °• Redness,  swelling, drainage, or foul odor at incision (surgical) sites °• If your incisions open or pull apart °• Swelling or pain in calf (lower leg) °• Diarrhea (Loose bowel movements that happen often), frequent watery, uncontrolled bowel movements °• Constipation, (no bowel movements for 3 days) if this happens: °o Take Milk of Magnesia, 2 tablespoons by mouth, 3 times a day for 2 days if needed °o Stop taking Milk of Magnesia once you have had a bowel movement °o Call your doctor if constipation continues °Or °o Take Miralax  (instead of Milk of Magnesia) following the label instructions °o Stop taking Miralax once you have had a bowel movement °o Call your doctor if constipation continues °• Anything you think is “abnormal for you” °  °Normal side effects after surgery: • Unable to sleep at night or unable to concentrate °• Irritability °• Being tearful (crying) or depressed ° °These are common complaints, possibly related to your anesthesia, stress of surgery, and change in lifestyle, that usually go away a few weeks after surgery. If these feelings continue, call your medical doctor.  °Wound Care: You may have surgical glue, steri-strips, or staples over your incisions after surgery °• Surgical glue: Looks like clear  film over your incisions and will wear off a little at a time °• Steri-strips: Adhesive strips of tape over your incisions. You may notice a yellowish color on skin under the steri-strips. This is used to make the steri-strips stick better. Do not pull the steri-strips off - let them fall off °• Staples: Staples may be removed before you leave the hospital °o If you go home with staples, call [facility] for an appointment with your surgeon’s nurse to have staples removed 10 days after surgery, [phone] °• Showering: You may shower two (2) days after your surgery unless your surgeon tells you differently °o Wash gently around incisions with warm soapy water, rinse well, and gently pat dry °o If you have a drain (tube from your incision), you may need someone to hold this while you shower °o No tub baths until staples are removed and incisions are healed °  °Medications: • Medications should be liquid or crushed if larger than the size of a dime °• Extended release pills (medication that releases a little bit at a time through the  day) should not be crushed °• Depending on the size and number of medications you take, you may need to space (take a few throughout the day)/change the time you take your medications so that you do not over-fill your pouch (smaller stomach) °• Make sure you follow-up with you primary care physician to make medication changes needed during rapid weight loss and life -style changes °•   If you have diabetes, follow up with your doctor that orders your diabetes medication(s) within one week after surgery and check your blood sugar regularly   Do not drive while taking narcotics (pain medications)   Do not take acetaminophen (Tylenol) and Roxicet or Lortab Elixir at the same time since these pain medications contain acetaminophen   Diet:  First 2 Weeks You will see the nutritionist about two (2) weeks after your surgery. The nutritionist will increase the types of  foods you can eat if you are handling liquids well:  If you have severe vomiting or nausea and cannot handle clear liquids lasting longer than 1 day call your surgeon Protein Shake  Drink at least 2 ounces of shake 5-6 times per day  Each serving of protein shakes (usually 8-12 ounces) should have a minimum of: o 15 grams of protein o And no more than 5 grams of carbohydrate  Goal for protein each day: o Men = 80 grams per day o Women = 60 grams per day     Protein powder may be added to fluids such as non-fat milk or Lactaid milk or Soy milk (limit to 35 grams added protein powder per serving)  Hydration  Slowly increase the amount of water and other clear liquids as tolerated (See Acceptable Fluids)  Slowly increase the amount of protein shake as tolerated  Sip fluids slowly and throughout the day  May use sugar substitutes in small amounts (no more than 6-8 packets per day; i.e. Splenda)  Fluid Goal  The first goal is to drink at least 8 ounces of protein shake/drink per day (or as directed by the nutritionist); some examples of protein shakes are [employer], [employer], EAS Edge HP, and Unjury. - See handout from pre-op Bariatric Education Class: o Slowly increase the amount of protein shake you drink as tolerated o You may find it easier to slowly sip shakes throughout the day o It is important to get your proteins in first  Your fluid goal is to drink 64-100 ounces of fluid daily o It may take a few weeks to build up to this   32 oz. (or more) should be clear liquids And  32 oz. (or more) should be full liquids (see below for examples)  Liquids should not contain sugar, caffeine, or carbonation  Clear Liquids:  Water of Sugar-free flavored water (i.e. Fruit HO, Propel)  Decaffeinated coffee or tea (sugar-free)  Crystal lite, Wylers Lite, Minute Maid Lite  Sugar-free Jell-O  Bouillon or broth  Sugar-free Popsicle:    - Less than 20 calories  each; Limit 1 per day  Full Liquids:                   Protein Shakes/Drinks + 2 choices per day of other full liquids  Full liquids must be: o No More Than 12 grams of Carbs per serving o No More Than 3 grams of Fat per serving  Strained low-fat cream soup  Non-Fat milk  Fat-free Lactaid Milk  Sugar-free yogurt (Dannon Lite & Fit, Greek yogurt)    Vitamins and Minerals  Start 1 day after surgery unless otherwise directed by your surgeon  2 Chewable Bariatric Multivitamin / Multimineral Supplement with iron  (Example: 3 Chewable Calcium  Plus 600 with Vitamin D-3) o Take 500 mg three (3) times a day for a total of 1500 mg each day o Do not take all 3 doses of calcium at one time as it may  cause constipation, and you can only absorb 500 mg at a time o Do not mix multivitamins containing iron with calcium supplements;  take 2 hours apart  Melciumnstruating women and those at risk for anemia ( a blood disease that causes weakness) may need extra iron o Talk to your doctor to see if you need more iron  If you need extra iron: Total daily Iron recommendation (including Vitamins) is 50 to 100 mg Iron/day  Do not stop taking or change any vitamins or minerals until you talk to your nutritionist or surgeon  Your nutritionist and/or surgeon must approve all vitamin and mineral supplements   Activity and Exercise: It is important to continue walking at home. Limit your physical activity as instructed by your doctor. During this time, use these guidelines:  Do not lift anything greater than ten  (10) pounds for at least two (2) weeks  Do not go back to work or drive until Designer, industrial/productyour surgeon says you can  You may have sex when you feel comfortable o It is VERY important for female patients to use a reliable birth control method; fertility often increase after surgery o Do not get pregnant for at least 18 months  Start exercising as soon as your doctor tells you that you can o Make sure  your doctor approves any physical activity  Start with a simple walking program  Walk 5-15 minutes each day, 7 days per week  Slowly increase until you are walking 30-45 minutes per day  Consider joining our BELT program. [phone] or email [email or web address]   Special Instructions Things to remember:  Use your CPAP when sleeping if this applies to you  Consider buying a medical alert bracelet that says you had lap-band surgery     You will likely have your first fill (fluid added to your band) 6 - 8 weeks after surgery  [facility] has a free Bariatric Surgery Support Group that meets monthly, the 3rd Thursday, 6pm. [facility]. You can see classes online at [email or web address]  It is very important to keep all follow up appointments with your surgeon, nutritionist, primary care physician, and behavioral health practitioner o After the first year, please follow up with your bariatric surgeon and nutritionist at least once a year in order to maintain best weight loss results                    [facility]:  [phone]               [facility]: [phone]               Bariatric Nurse Coordinator: [phone]- [phone]  Gastric Bypass/Sleeve Home Care Instructions  Rev. 08/2012                                                         Reviewed and Endorsed                                                    by [facility] Patient Education Committee, Jan, 2014

## 2017-04-19 NOTE — Discharge Summary (Signed)
Physician Discharge Summary  Abigail Morales GEZ:[id number] DOB: 1986-01-17 DOA: 04/18/2017  PCP: Jani Gravel, MD  Admit date: 04/18/2017 Discharge date: 04/19/2017  Recommendations for Outpatient Follow-up:  1. F/u with PCP within 1-2 weeks   Discharge Diagnoses:  Active Problems:   Morbid obesity ([place])   Surgical Procedure: Laparoscopic Sleeve Gastrectomy, upper endoscopy  Discharge Condition: Good Disposition: Home  Diet recommendation: Postoperative sleeve gastrectomy diet (liquids only)  Filed Weights   04/18/17 0506  Weight: 110.5 kg (243 lb 9.6 oz)     Hospital Course:  The patient was admitted for a planned laparoscopic sleeve gastrectomy. Please see operative note. Preoperatively the patient was given lovenox for DVT prophylaxis. Postoperative prophylactic Lovenox dosing was started on the morning of postoperative day 1. On the evening of postoperative day 0, the patient was started on water and ice chips. On postoperative day 1 the patient had no fever or tachycardia and was tolerating water in their diet was gradually advanced throughout the day. The patient was ambulating without difficulty. Their vital signs are stable without fever or tachycardia. Their hemoglobin had remained stable.  The patient had received discharge instructions and counseling. They were deemed stable for discharge and had met discharge criteria   Discharge Instructions  Discharge Instructions    Ambulate hourly while awake    Complete by:  As directed    Call MD for:  difficulty breathing, headache or visual disturbances    Complete by:  As directed    Call MD for:  persistant dizziness or light-headedness    Complete by:  As directed    Call MD for:  persistant nausea and vomiting    Complete by:  As directed    Call MD for:  redness, tenderness, or signs of infection (pain, swelling, redness, odor or green/yellow discharge around incision site)    Complete by:  As directed    Call MD  for:  severe uncontrolled pain    Complete by:  As directed    Call MD for:  temperature >101 F    Complete by:  As directed    Incentive spirometry    Complete by:  As directed    Perform hourly while awake     Allergies as of 04/19/2017      Reactions   Robitussin (alcohol Free) [guaifenesin] Other (See Comments)   Hallucinations   Sertraline Other (See Comments)   Chest pain      Medication List    TAKE these medications   acetaminophen 500 MG tablet Commonly known as:  TYLENOL Take 1,000 mg by mouth every 6 (six) hours as needed for mild pain, moderate pain or headache.   albuterol 108 (90 Base) MCG/ACT inhaler Commonly known as:  PROVENTIL HFA;VENTOLIN HFA Inhale 1-2 puffs into the lungs every 6 (six) hours as needed for wheezing or shortness of breath.   amphetamine-dextroamphetamine 10 MG tablet Commonly known as:  ADDERALL Take 10-20 mg by mouth 2 (two) times daily with a meal. Takes '20mg'$  in the AM and '10mg'$  in the afternoon   calcium carbonate 1250 (500 Ca) MG tablet Commonly known as:  OS-CAL - dosed in mg of elemental calcium Take 500 tablets by mouth 3 (three) times daily with meals.   docusate sodium 100 MG capsule Commonly known as:  COLACE Take 1 capsule (100 mg total) by mouth 2 (two) times daily.   ENBREL 50 MG/ML injection Generic drug:  etanercept Inject 50 mg into the skin every Tuesday.   esomeprazole  40 MG capsule Commonly known as:  NEXIUM Take 1 capsule (40 mg total) by mouth daily. Prescription given at preop appointment   [place] 1-20 MG-MCG tablet Generic drug:  norethindrone-ethinyl estradiol Take 1 tablet by mouth at bedtime. Patient told by dr Kae Heller ok to continue oral contraceptives   multivitamin tablet Take 1 tablet by mouth daily. Patient is taking multivitamin pack which are preop vitamins- vit b 12- 500 mcg, vitamin A 5000IU , citamin C 90 mg,vitamin D 800 Iu , vitamin E 45 IU vitamin K '160mg'$ , vitamin B6 4 mg, Folate 800  mcg, Calcium '600mg'$    multivitamin with minerals Tabs tablet Take 1 tablet by mouth daily.   ondansetron 4 MG disintegrating tablet Commonly known as:  ZOFRAN ODT Take 1 tablet (4 mg total) by mouth every 8 (eight) hours as needed for nausea or vomiting. Prescription given at preop appointment   oxyCODONE 5 MG/5ML solution Commonly known as:  ROXICODONE Take 5 mLs (5 mg total) by mouth every 6 (six) hours as needed for severe pain. Prescription given at preop appointment   protein supplement shake Liqd Commonly known as:  PREMIER PROTEIN Take 2 oz by mouth daily.   ZANTAC PO Take 1 tablet by mouth at bedtime as needed (for acid reflex).         The results of significant diagnostics from this hospitalization (including imaging, microbiology, ancillary and laboratory) are listed below for reference.    Significant Diagnostic Studies: No results found.  Labs: Basic Metabolic Panel:  Recent Labs Lab 04/13/17 1336 04/19/17 0542  NA 137 137  K 3.7 3.9  CL 104 105  CO2 24 24  GLUCOSE 106* 102*  BUN 16 7  CREATININE 0.74 0.59  CALCIUM 9.1 8.0*   Liver Function Tests:  Recent Labs Lab 04/13/17 1336 04/19/17 0542  AST 18 21  ALT 15 22  ALKPHOS 49 39  BILITOT 0.3 0.4  PROT 7.5 6.3*  ALBUMIN 3.7 3.0*    CBC:  Recent Labs Lab 04/13/17 1336 04/19/17 0542  WBC 9.8 10.8*  NEUTROABS 5.8 7.7  HGB 12.8 12.1  HCT 38.6 35.8*  MCV 81.1 81.2  PLT 279 232    CBG: No results for input(s): GLUCAP in the last 168 hours.  Active Problems:   Morbid obesity [facility])   Signed:  [facility], [place] [phone] 04/19/2017, 8:48 AM

## 2017-04-21 ENCOUNTER — Telehealth (HOSPITAL_COMMUNITY): Payer: Self-pay

## 2017-04-21 NOTE — Telephone Encounter (Signed)
Follow up with bariatric surgical patient to discuss post discharge questions.  No answer at this time voicemail left for patient along with contact information to discuss the following questions.  1.  Are you having any pain not relieved by pain medication?no, has not used pain medication since discharge  2.  How much fluid total fluid intake have you had in the last 24/48 hours?  36 ounces +  3.  How much protein intake have you had in the last 24/48 hours?15 grams, she has ordered some unflavored protein that should arrive to day,  We discussed protein goal of 60 gm and importance of reaching that goal  4.  Have you had any trouble making urine?no problem  5.  Have you had nausea that has not been relieved by nausea medication?no has not used nausea medication  6.  Are you ambulating every hour?yes  7.  Are you passing gas or had a BM?yes passing gas, taking colace  8.  Do you know how to contact [facility]? [facility]? [facility]?able to verbalize all contact information  9.  Are you taking your vitamins and calcium without difficulty?had trouble getting 3rd calcium yesterday, understands vitamin schedule  10. Tell me how your incision looks?  Any redness, open incision, or drainage?look fine

## 2017-04-24 ENCOUNTER — Telehealth (HOSPITAL_COMMUNITY): Payer: Self-pay

## 2017-04-24 NOTE — Telephone Encounter (Signed)
Increased protein intake to 45-60 grams from 15 grams.  Fluids continue to be in adequate amounts.  Patient question regarding menstrual cramps.  Discussed liquid adult Tylenol with patient as she does not want to take Roxicodone for this pain.  Patient aware of contact information for [facility], [facility], and [facility].

## 2017-05-02 ENCOUNTER — Encounter: Payer: Self-pay | Admitting: Skilled Nursing Facility1

## 2017-05-02 ENCOUNTER — Encounter: Payer: Federal, State, Local not specified - PPO | Attending: Surgery | Admitting: Skilled Nursing Facility1

## 2017-05-02 DIAGNOSIS — Z6839 Body mass index (BMI) 39.0-39.9, adult: Secondary | ICD-10-CM | POA: Insufficient documentation

## 2017-05-02 DIAGNOSIS — Z23 Encounter for immunization: Secondary | ICD-10-CM | POA: Diagnosis not present

## 2017-05-02 NOTE — Progress Notes (Signed)
Bariatric Class:  Appt start time: 1530 end time:  1630.  2 Week Post-Operative Nutrition Class  Patient was seen on 05/02/2017 for Post-Operative Nutrition education at [facility].   Surgery date: 04/18/2017 Surgery type: sleeve Start weight at [facility]: 243.9 Weight today: 231.8  TANITA  BODY COMP RESULTS  05/02/2017   BMI (kg/m^2) 38.6   Fat Mass (lbs) 115.4   Fat Free Mass (lbs) 116.4   Total Body Water (lbs) 85.6   The following the learning objectives were met by the patient during this course:  Identifies Phase 3A (Soft, High Proteins) Dietary Goals and will begin from 2 weeks post-operatively to 2 months post-operatively  Identifies appropriate sources of fluids and proteins   States protein recommendations and appropriate sources post-operatively  Identifies the need for appropriate texture modifications, mastication, and bite sizes when consuming solids  Identifies appropriate multivitamin and calcium sources post-operatively  Describes the need for physical activity post-operatively and will follow MD recommendations  States when to call healthcare provider regarding medication questions or post-operative complications  Handouts given during class include:  Phase 3A: Soft, High Protein Diet Handout  Follow-Up Plan: Patient will follow-up at [facility] in 6 weeks for 2 month post-op nutrition visit for diet advancement per MD.

## 2017-06-13 ENCOUNTER — Encounter: Payer: Federal, State, Local not specified - PPO | Attending: Surgery | Admitting: Registered"

## 2017-06-13 ENCOUNTER — Encounter: Payer: Self-pay | Admitting: Registered"

## 2017-06-13 DIAGNOSIS — Z6839 Body mass index (BMI) 39.0-39.9, adult: Secondary | ICD-10-CM | POA: Insufficient documentation

## 2017-06-13 DIAGNOSIS — E669 Obesity, unspecified: Secondary | ICD-10-CM

## 2017-06-13 NOTE — Patient Instructions (Signed)
Goals:  Follow Phase 3B: High Protein + Non-Starchy Vegetables  Eat 3-6 small meals/snacks, every 3-5 hrs  Increase lean protein foods to meet 60g goal  Increase fluid intake to 64oz +  Avoid drinking 15 minutes before, during and 30 minutes after eating  Aim for >30 min of physical activity daily  

## 2017-06-13 NOTE — Progress Notes (Signed)
Follow-up visit:  8 Weeks Post-Operative Sleeve gastrectomy Surgery  Medical Nutrition Therapy:  Appt start time: 2:08 end time:  2:38.  Primary concerns today: Post-operative Bariatric Surgery Nutrition Management.  Non scale victories: clothes fit better, "I feel smaller", feels better, sleep better  Surgery date: 04/18/2017 Surgery type: Sleeve gastrectomy [facility] Weight today: 222.2 lbs Weight change: 9.6 lbs from 231.8 on 05/02/2017 Total weight lost: 21.7 lbs Weight loss goal: be healthy, feel better, improve joint pain   TANITA  BODY COMP RESULTS  05/02/2017 06/13/2017   BMI (kg/m^2) 38.6 37.0   Fat Mass (lbs) 115.4 110.0   Fat Free Mass (lbs) 116.4 112.2   Total Body Water (lbs) 85.6 82.2   Pt states she is doing well; no complications and tolerating everything well. Pt states Opurity multivitamin is disgusting and wants to try something different. Pt states she is experiecing quite a bit of hair loss. Pt states she had knee surgery shortly before bariatric surgery and has been stressed as well. Pt was given [employer] chewable multivitamin to try: Lot # [id number] Exp 01/2019  Preferred Learning Style:   No preference indicated   Learning Readiness:   Ready  Change in progress  24-hr recall: B (AM): fair life milk (13g), boiled egg (6g), string cheese (6g) Snk (AM): 1/2 cheese (3g)  L (PM): 2 oz beef (14g), small amount cheese (6g) Snk (PM): none  D (PM): 2 oz pot roast (14g) Snk (PM): none  Fluid intake: water (80 oz), 2% fair-life milk (13g, 8 oz); ~88 oz Estimated total protein intake: ~62 grams  Medications: See list Supplementation: Opurity + 3 calcium  Using straws: sometimes with water Drinking while eating: no Having you been chewing well: yes Chewing/swallowing difficulties: no Changes in vision: no Changes to mood/headaches: no Hair loss/Changes to skin/Changes to nails: yes, no, no Any difficulty focusing or  concentrating: no Sweating: no Dizziness/Lightheaded: no Palpitations: no  Carbonated beverages: no N/V/D/C/GAS: no, no, no, no, no Abdominal Pain: no Dumping syndrome: no Last Lap-Band fill: N/A  Recent physical activity:  Walking   Progress Towards Goal(s):  In progress.  Handouts given during visit include:  Phase 3B: High protein + NS vegetables   Nutritional Diagnosis:  [place]-3.3 Overweight/obesity related to past poor dietary habits and physical inactivity as evidenced by patient w/ recent sleeve gastrectomy surgery following dietary guidelines for continued weight loss.     Intervention:  Nutrition education and counseling. Goals:  Follow Phase 3B: High Protein + Non-Starchy Vegetables  Eat 3-6 small meals/snacks, every 3-5 hrs  Increase lean protein foods to meet 60g goal  Increase fluid intake to 64oz +  Avoid drinking 15 minutes before, during and 30 minutes after eating  Aim for >30 min of physical activity daily  Teaching Method Utilized:  Visual Auditory Hands on  Barriers to learning/adherence to lifestyle change: none  Demonstrated degree of understanding via:  Teach Back   Monitoring/Evaluation:  Dietary intake, exercise, lap band fills, and body weight. Follow up in 4 months for 6 month post-op visit.

## 2017-06-20 DIAGNOSIS — M67432 Ganglion, left wrist: Secondary | ICD-10-CM | POA: Diagnosis not present

## 2017-06-20 DIAGNOSIS — K080 Exfoliation of teeth due to systemic causes: Secondary | ICD-10-CM | POA: Diagnosis not present

## 2017-08-10 DIAGNOSIS — K080 Exfoliation of teeth due to systemic causes: Secondary | ICD-10-CM | POA: Diagnosis not present

## 2017-09-07 DIAGNOSIS — Z0000 Encounter for general adult medical examination without abnormal findings: Secondary | ICD-10-CM | POA: Diagnosis not present

## 2017-09-07 DIAGNOSIS — F988 Other specified behavioral and emotional disorders with onset usually occurring in childhood and adolescence: Secondary | ICD-10-CM | POA: Diagnosis not present

## 2017-09-07 DIAGNOSIS — L4050 Arthropathic psoriasis, unspecified: Secondary | ICD-10-CM | POA: Diagnosis not present

## 2017-09-07 DIAGNOSIS — E669 Obesity, unspecified: Secondary | ICD-10-CM | POA: Diagnosis not present

## 2017-09-11 DIAGNOSIS — R350 Frequency of micturition: Secondary | ICD-10-CM | POA: Diagnosis not present

## 2017-09-11 DIAGNOSIS — Z01411 Encounter for gynecological examination (general) (routine) with abnormal findings: Secondary | ICD-10-CM | POA: Diagnosis not present

## 2017-09-11 DIAGNOSIS — Z6835 Body mass index (BMI) 35.0-35.9, adult: Secondary | ICD-10-CM | POA: Diagnosis not present

## 2017-09-11 DIAGNOSIS — Z124 Encounter for screening for malignant neoplasm of cervix: Secondary | ICD-10-CM | POA: Diagnosis not present

## 2017-09-14 DIAGNOSIS — L4059 Other psoriatic arthropathy: Secondary | ICD-10-CM | POA: Diagnosis not present

## 2017-09-25 DIAGNOSIS — M79672 Pain in left foot: Secondary | ICD-10-CM | POA: Diagnosis not present

## 2017-09-25 DIAGNOSIS — L4059 Other psoriatic arthropathy: Secondary | ICD-10-CM | POA: Diagnosis not present

## 2017-09-25 DIAGNOSIS — L409 Psoriasis, unspecified: Secondary | ICD-10-CM | POA: Diagnosis not present

## 2017-10-12 ENCOUNTER — Encounter: Payer: Federal, State, Local not specified - PPO | Attending: Surgery | Admitting: Registered"

## 2017-10-12 ENCOUNTER — Encounter: Payer: Self-pay | Admitting: Registered"

## 2017-10-12 DIAGNOSIS — Z6841 Body Mass Index (BMI) 40.0 and over, adult: Secondary | ICD-10-CM | POA: Diagnosis not present

## 2017-10-12 DIAGNOSIS — Z713 Dietary counseling and surveillance: Secondary | ICD-10-CM | POA: Diagnosis not present

## 2017-10-12 DIAGNOSIS — E669 Obesity, unspecified: Secondary | ICD-10-CM

## 2017-10-12 NOTE — Patient Instructions (Addendum)
-   Try  Fair Life skim milk option.   - Track protein to make sure you're getting 60 grams a day; Baritastic App.

## 2017-10-12 NOTE — Progress Notes (Signed)
Follow-up visit: 6 Months Post-Operative Sleeve gastrectomy Surgery  Medical Nutrition Therapy:  Appt start time: 2:00 end time: 2:35  Primary concerns today: Post-operative Bariatric Surgery Nutrition Management.  Non scale victories: clothes are a lot bigger, "I feel smaller", feels better, sleep better, more energy  Surgery date: 04/18/2017 Surgery type: Sleeve gastrectomy [facility] Weight today: 210.0 lbs Weight change: 12.2 lbs from 222.2 on (06/13/2017) Total weight lost: 33.9 lbs Weight loss goal: be healthy, feel better, improve joint pain   TANITA  BODY COMP RESULTS  05/02/2017 06/13/2017 10/12/2017   BMI (kg/m^2) 38.6 37.0 34.9   Fat Mass (lbs) 115.4 110.0 99.6   Fat Free Mass (lbs) 116.4 112.2 110.4   Total Body Water (lbs) 85.6 82.2 80.4   Pt states she is doing well; no complications and tolerating everything well. Pt states she does not like greek yogurt as much anymore. Pt states surgeon old her she is losing slower than most and that made her feel like she was not doing things well, like she had failed. Pt states ProCare was nasty and tolerates Opurity. Pt states she has experienced a lot of hair loss and thinks it is due to stress from knee injury and knee surgery. Pt states she has bowel movements once every 3 days.   Preferred Learning Style:   No preference indicated   Learning Readiness:   Ready  Change in progress  24-hr recall: B (AM): fair life milk (13g) Snk (AM): omelette, cheese (12g) peppers, sometimes  8-10 pepperoni (4g) L (PM): 2 oz beef (14g), small amount cheese (6g) Snk (PM): none  D (PM): 2 oz pork chops (14g), seasoned vegetables Snk (PM): none  Fluid intake: water (80 oz), 2% fair-life milk (13g, 8 oz); ~88 oz Estimated total protein intake: ~57 grams  Medications: See list Supplementation: Opurity + 3 calcium  Using straws: sometimes with milk Drinking while eating: no Having you been chewing well:  yes Chewing/swallowing difficulties: no Changes in vision: no Changes to mood/headaches: no Hair loss/Changes to skin/Changes to nails: yes, no, no Any difficulty focusing or concentrating: no Sweating: no Dizziness/Lightheaded: no Palpitations: no  Carbonated beverages: no N/V/D/C/GAS: no, no, no, yes-takes stool softeners, no Abdominal Pain: no Dumping syndrome: no Last Lap-Band fill: N/A  Recent physical activity:  Walking 60 min, 7 days/week some weight lifting and rowing  Progress Towards Goal(s):  In progress.  Handouts given during visit include:  Iron-rich food  Snack Ideas   Nutritional Diagnosis:  [place]-3.3 Overweight/obesity related to past poor dietary habits and physical inactivity as evidenced by patient w/ recent sleeve gastrectomy surgery following dietary guidelines for continued weight loss.     Intervention:  Nutrition education and counseling. Goals: - Try  Fair Life skim milk option.  - Track protein to make sure you're getting 60 grams a day; Baritastic App.   Teaching Method Utilized:  Visual Auditory Hands on  Barriers to learning/adherence to lifestyle change: none  Demonstrated degree of understanding via:  Teach Back   Monitoring/Evaluation:  Dietary intake, exercise, lap band fills, and body weight. Follow up in 3 months for 9 month post-op visit.

## 2017-11-01 DIAGNOSIS — K080 Exfoliation of teeth due to systemic causes: Secondary | ICD-10-CM | POA: Diagnosis not present

## 2017-12-15 DIAGNOSIS — R5383 Other fatigue: Secondary | ICD-10-CM | POA: Diagnosis not present

## 2017-12-15 DIAGNOSIS — N925 Other specified irregular menstruation: Secondary | ICD-10-CM | POA: Diagnosis not present

## 2017-12-15 DIAGNOSIS — L659 Nonscarring hair loss, unspecified: Secondary | ICD-10-CM | POA: Diagnosis not present

## 2018-01-11 ENCOUNTER — Encounter: Payer: Self-pay | Admitting: Registered"

## 2018-01-11 ENCOUNTER — Encounter: Payer: Federal, State, Local not specified - PPO | Attending: Surgery | Admitting: Registered"

## 2018-01-11 DIAGNOSIS — Z6841 Body Mass Index (BMI) 40.0 and over, adult: Secondary | ICD-10-CM | POA: Insufficient documentation

## 2018-01-11 DIAGNOSIS — E669 Obesity, unspecified: Secondary | ICD-10-CM

## 2018-01-11 DIAGNOSIS — Z713 Dietary counseling and surveillance: Secondary | ICD-10-CM | POA: Insufficient documentation

## 2018-01-11 NOTE — Patient Instructions (Signed)
-   Try  Fair Life skim milk option.  - Try bariatric multivitamin capsule.   - Aim to have 5 days of physical activity with 2 non-consecutive rest days.   - Check into attending support group. See handout.

## 2018-01-11 NOTE — Progress Notes (Signed)
Follow-up visit: 9 Months Post-Operative Sleeve gastrectomy Surgery  Medical Nutrition Therapy:  Appt start time: 2:00 end time: 2:30  Primary concerns today: Post-operative Bariatric Surgery Nutrition Management.  Non scale victories: clothes are a lot bigger, "I feel smaller", feels better, sleep better, more energy, less back pain  Surgery date: 04/18/2017 Surgery type: Sleeve gastrectomy [facility] Weight today: 206.2 lbs Weight change: 4 lbs from 210.0 on (10/12/2016) Total weight lost: 37.7 lbs Weight loss goal: be healthy, feel better, less than 200 lbs  TANITA  BODY COMP RESULTS  05/02/2017 06/13/2017 10/12/2017 01/11/2018   BMI (kg/m^2) 38.6 37.0 34.9 34.3   Fat Mass (lbs) 115.4 110.0 99.6 93.4   Fat Free Mass (lbs) 116.4 112.2 110.4 112.8   Total Body Water (lbs) 85.6 82.2 80.4 82.0   Pt states she has stopped drinking milk but unsure exactly why, states maybe because it was high in carbs or fat. Pt states she wants to be less than 200 lbs because she has not been that weight since high school. Pt states she is losing slower than she thought but its happy with the way things are. Pt states she has bowel movements once every 3 days. Pt states she has to take stool softeners every other week.   Preferred Learning Style:   No preference indicated   Learning Readiness:   Ready  Change in progress  24-hr recall: B (AM): fair life milk (13g) Snk (AM): omelette, cheese (12g) peppers, sometimes  8-10 pepperoni (4g) L (PM): 2 oz beef (14g), small amount cheese (6g) Snk (PM): none  D (PM): 2 oz pork chops (14g), seasoned vegetables Snk (PM): none  Fluid intake: water (80 oz), 2% fair-life milk (13g, 8 oz); ~88 oz Estimated total protein intake: 60+ grams  Medications: See list Supplementation: Opurity + 3 calcium  Using straws: sometimes with milk Drinking while eating: no Having you been chewing well: yes Chewing/swallowing difficulties:  no Changes in vision: no Changes to mood/headaches: no Hair loss/Changes to skin/Changes to nails: has improved, no, no Any difficulty focusing or concentrating: no Sweating: no Dizziness/Lightheaded: no Palpitations: no  Carbonated beverages: no N/V/D/C/GAS: no, no, no, yes-takes stool softeners sometimes, no Abdominal Pain: no Dumping syndrome: no Last Lap-Band fill: N/A  Recent physical activity:  Walking 60 min, 7 days/week some weight lifting and rowing 30 min, 2x/week  Progress Towards Goal(s):  In progress.  Handouts given during visit include:  [place] V: Protein + All vegetables   Nutritional Diagnosis:  [place]-3.3 Overweight/obesity related to past poor dietary habits and physical inactivity as evidenced by patient w/ recent sleeve gastrectomy surgery following dietary guidelines for continued weight loss.     Intervention:  Nutrition education and counseling. Pt was educated on the next phase of the diet. Pt was in agreement with goals listed.  Goals: - Try  Fair Life skim milk option. - Try bariatric multivitamin capsule.  - Aim to have 5 days of physical activity with 2 non-consecutive rest days.  - Check into attending support group. See handout.   Teaching Method Utilized:  Visual Auditory Hands on  Barriers to learning/adherence to lifestyle change: none  Demonstrated degree of understanding via:  Teach Back   Monitoring/Evaluation:  Dietary intake, exercise, lap band fills, and body weight. Follow up in 3 months for 12 month post-op visit.

## 2018-03-06 DIAGNOSIS — K080 Exfoliation of teeth due to systemic causes: Secondary | ICD-10-CM | POA: Diagnosis not present

## 2018-03-06 DIAGNOSIS — Z0000 Encounter for general adult medical examination without abnormal findings: Secondary | ICD-10-CM | POA: Diagnosis not present

## 2018-03-21 DIAGNOSIS — Z23 Encounter for immunization: Secondary | ICD-10-CM | POA: Diagnosis not present

## 2018-03-28 DIAGNOSIS — Z79899 Other long term (current) drug therapy: Secondary | ICD-10-CM | POA: Diagnosis not present

## 2018-03-28 DIAGNOSIS — M2550 Pain in unspecified joint: Secondary | ICD-10-CM | POA: Diagnosis not present

## 2018-03-28 DIAGNOSIS — L4059 Other psoriatic arthropathy: Secondary | ICD-10-CM | POA: Diagnosis not present

## 2018-03-28 DIAGNOSIS — L409 Psoriasis, unspecified: Secondary | ICD-10-CM | POA: Diagnosis not present

## 2018-04-17 ENCOUNTER — Ambulatory Visit: Payer: Federal, State, Local not specified - PPO

## 2018-05-18 DIAGNOSIS — L409 Psoriasis, unspecified: Secondary | ICD-10-CM | POA: Diagnosis not present

## 2018-05-18 DIAGNOSIS — Z0000 Encounter for general adult medical examination without abnormal findings: Secondary | ICD-10-CM | POA: Diagnosis not present

## 2018-05-18 DIAGNOSIS — J45990 Exercise induced bronchospasm: Secondary | ICD-10-CM | POA: Diagnosis not present

## 2018-05-18 DIAGNOSIS — F909 Attention-deficit hyperactivity disorder, unspecified type: Secondary | ICD-10-CM | POA: Diagnosis not present

## 2018-06-24 DIAGNOSIS — M5136 Other intervertebral disc degeneration, lumbar region: Secondary | ICD-10-CM | POA: Diagnosis not present

## 2018-07-20 DIAGNOSIS — K080 Exfoliation of teeth due to systemic causes: Secondary | ICD-10-CM | POA: Diagnosis not present

## 2018-07-30 DIAGNOSIS — K029 Dental caries, unspecified: Secondary | ICD-10-CM | POA: Diagnosis not present

## 2018-09-14 DIAGNOSIS — Z01419 Encounter for gynecological examination (general) (routine) without abnormal findings: Secondary | ICD-10-CM | POA: Diagnosis not present

## 2018-09-14 DIAGNOSIS — Z118 Encounter for screening for other infectious and parasitic diseases: Secondary | ICD-10-CM | POA: Diagnosis not present

## 2018-09-14 DIAGNOSIS — Z6832 Body mass index (BMI) 32.0-32.9, adult: Secondary | ICD-10-CM | POA: Diagnosis not present

## 2018-09-26 DIAGNOSIS — Z79899 Other long term (current) drug therapy: Secondary | ICD-10-CM | POA: Diagnosis not present

## 2018-09-26 DIAGNOSIS — L409 Psoriasis, unspecified: Secondary | ICD-10-CM | POA: Diagnosis not present

## 2018-09-26 DIAGNOSIS — L4059 Other psoriatic arthropathy: Secondary | ICD-10-CM | POA: Diagnosis not present

## 2018-09-26 DIAGNOSIS — M2550 Pain in unspecified joint: Secondary | ICD-10-CM | POA: Diagnosis not present

## 2018-10-16 IMAGING — RF DG UGI W/ KUB
8 series · 11 of 11 positions shown · non-contrast
Comparison: 08/26/2010

CLINICAL DATA: Preoperative evaluation for bariatric surgery.

EXAM:
UPPER GI SERIES WITH KUB
TECHNIQUE: After obtaining a scout radiograph a routine upper GI series was
performed using thin barium.
FLUOROSCOPY TIME:  Fluoroscopy Time:  0 minutes and 36 seconds.
Radiation Exposure Index (if provided by the fluoroscopic device):
16.5 mGy
Number of Acquired Spot Images: 0

[Series 1: t abdomen supine · 0.15mm/px · 1 of 1 slices shown]
[im 1/1]
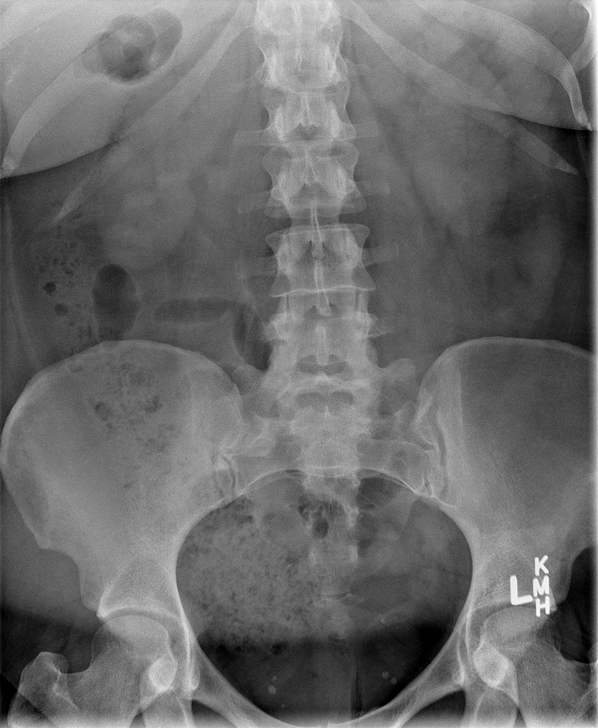

[Series 2: cp_standard · 0.51mm/px · 4 of 37 frames shown (1 of 4)]
[frame 1/37]
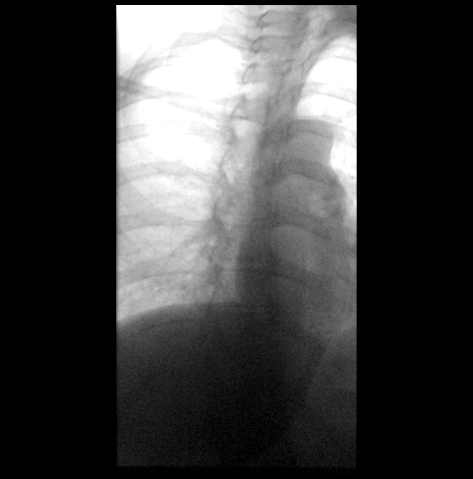
[frame 6/37]
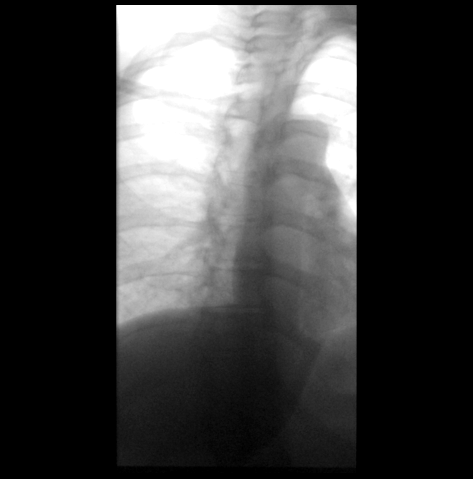
[frame 19/37]
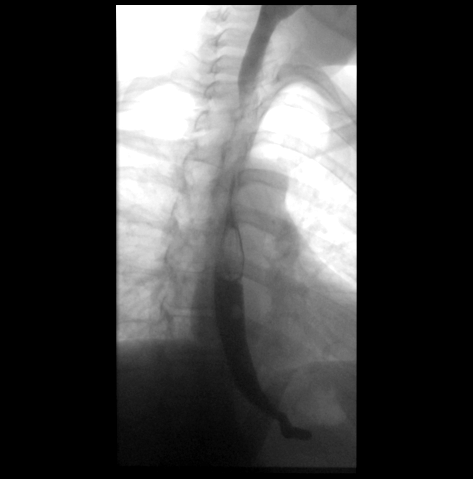
[frame 32/37]
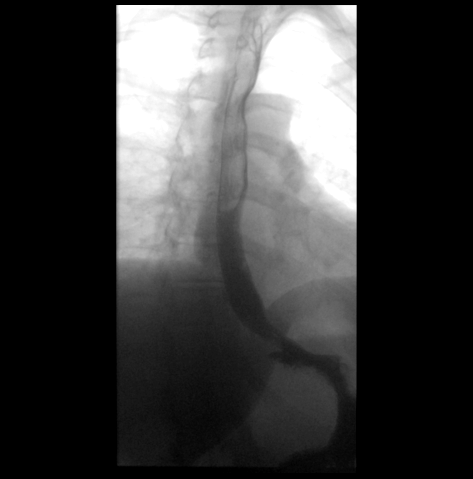

[Series 3: fluoro_barium singleshot_bw · 0.17mm/px · 1 of 1 slices shown (1 of 3)]
[im 1/1]
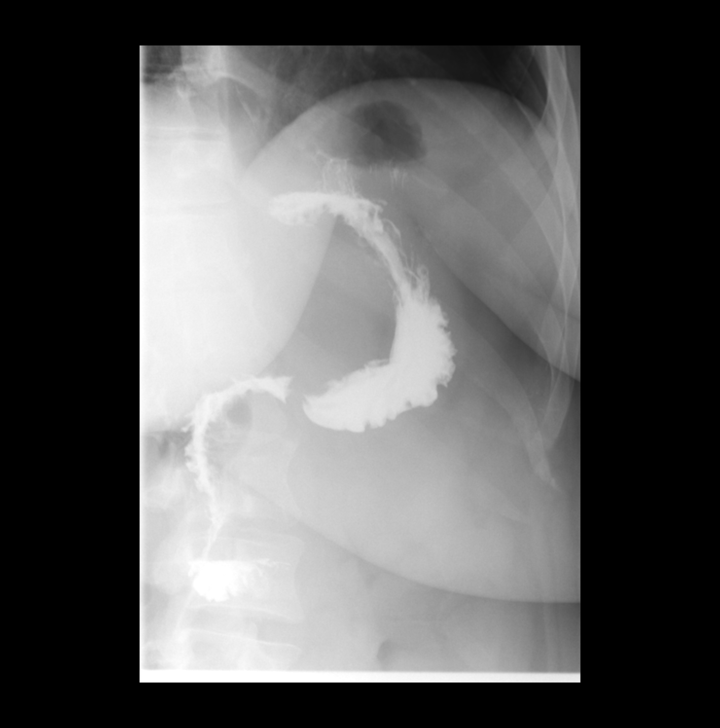

[Series 4: cp_standard · 0.25mm/px · 1 of 1 slices shown (2 of 4)]
[im 1/1]
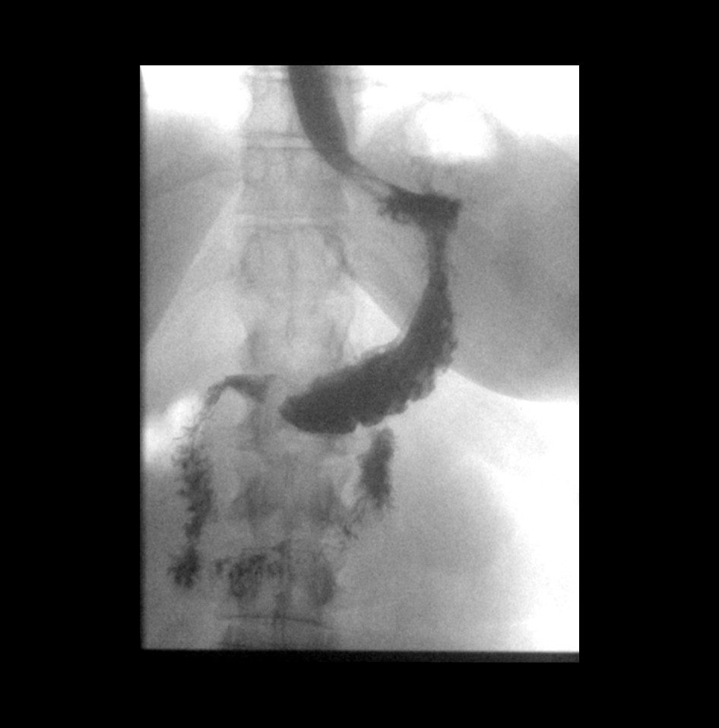

[Series 5: cp_standard · 0.25mm/px · 1 of 1 slices shown (3 of 4)]
[im 1/1]
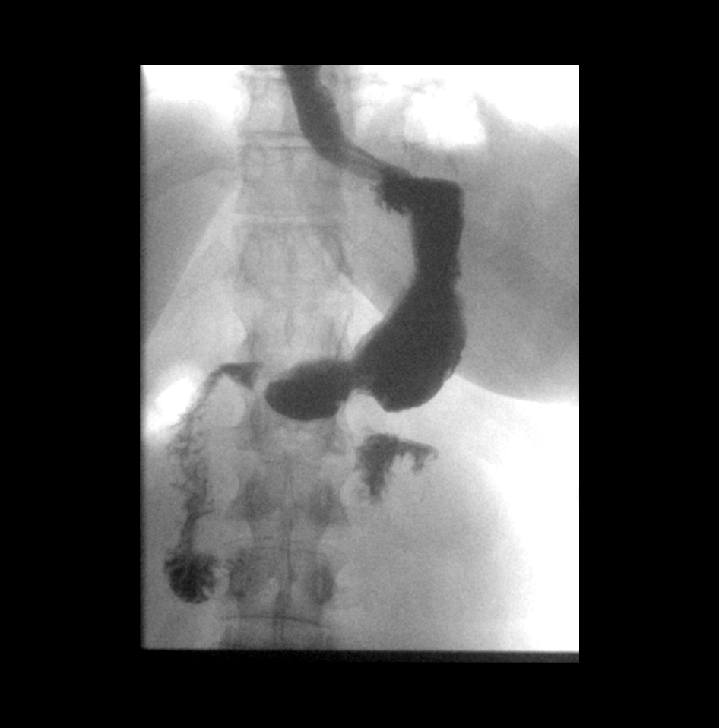

[Series 6: fluoro_barium singleshot_bw · 0.17mm/px · 1 of 1 slices shown (2 of 3)]
[im 1/1]
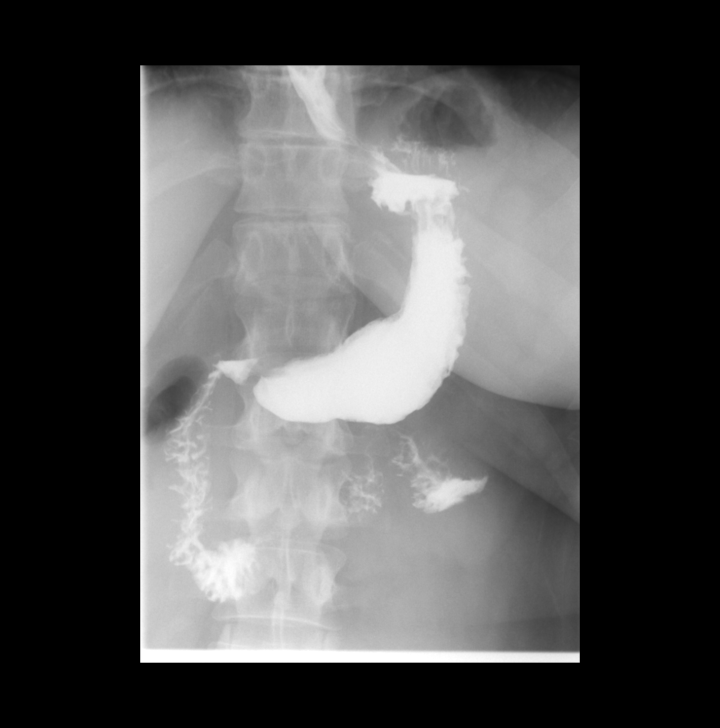

[Series 7: fluoro_barium singleshot_bw · 0.20mm/px · 1 of 1 slices shown (3 of 3)]
[im 1/1]
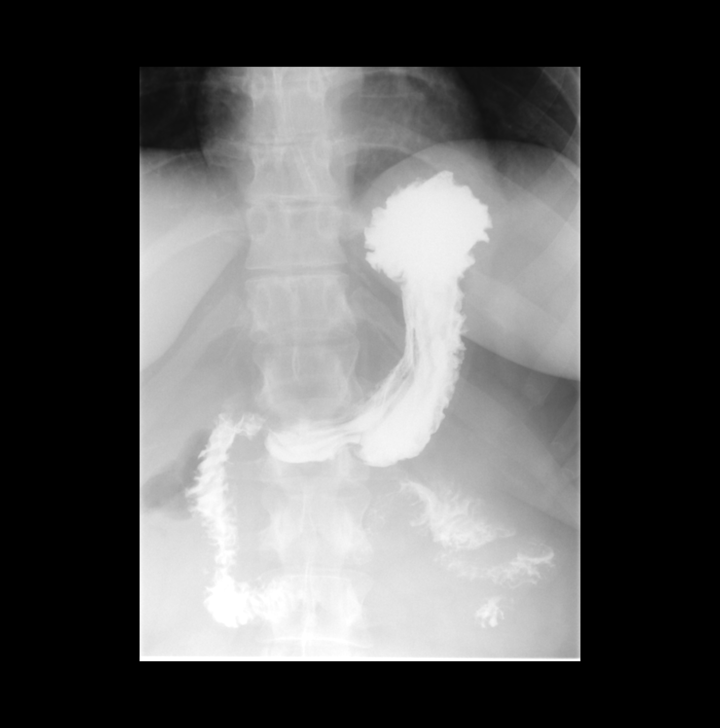

[Series 8: cp_standard · 0.30mm/px · 1 of 1 slices shown (4 of 4)]
[im 1/1]
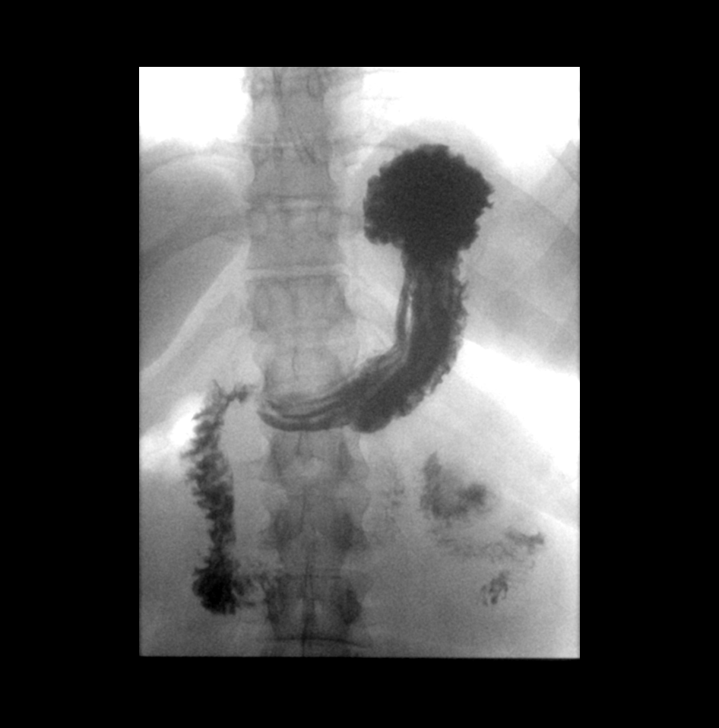

[11 of 11 positions shown; findings below may reference images not displayed]

FINDINGS: Pre-procedure KUB shows a normal bowel gas pattern. The visualized
bony anatomy is unremarkable.

Patient was given thin barium by mouth, demonstrating normal
esophageal location, contour, and diameter. No evidence for
esophageal stricture or gross ulceration. No esophageal mass-effect.

Stomach is in the expected anatomic location without evidence of
hiatal hernia. Gastric emptying is prompt through a normal pylorus.
The duodenal bulb and duodenal C-loop are normally positioned as is
the ligament of Treitz. Duodenum has normal single contrast
appearance.
IMPRESSION: Normal pre bariatric single contrast upper GI series.

## 2018-11-19 DIAGNOSIS — F909 Attention-deficit hyperactivity disorder, unspecified type: Secondary | ICD-10-CM | POA: Diagnosis not present

## 2018-11-19 DIAGNOSIS — Z0000 Encounter for general adult medical examination without abnormal findings: Secondary | ICD-10-CM | POA: Diagnosis not present

## 2019-01-14 DIAGNOSIS — M79661 Pain in right lower leg: Secondary | ICD-10-CM | POA: Diagnosis not present

## 2019-04-01 DIAGNOSIS — Z79899 Other long term (current) drug therapy: Secondary | ICD-10-CM | POA: Diagnosis not present

## 2019-04-01 DIAGNOSIS — L4059 Other psoriatic arthropathy: Secondary | ICD-10-CM | POA: Diagnosis not present

## 2019-04-01 DIAGNOSIS — M2550 Pain in unspecified joint: Secondary | ICD-10-CM | POA: Diagnosis not present

## 2019-04-01 DIAGNOSIS — L409 Psoriasis, unspecified: Secondary | ICD-10-CM | POA: Diagnosis not present

## 2019-05-01 DIAGNOSIS — Z01812 Encounter for preprocedural laboratory examination: Secondary | ICD-10-CM | POA: Diagnosis not present

## 2019-05-22 ENCOUNTER — Telehealth: Payer: Federal, State, Local not specified - PPO | Admitting: Nurse Practitioner

## 2019-05-22 DIAGNOSIS — R059 Cough, unspecified: Secondary | ICD-10-CM

## 2019-05-22 DIAGNOSIS — Z20828 Contact with and (suspected) exposure to other viral communicable diseases: Secondary | ICD-10-CM

## 2019-05-22 DIAGNOSIS — Z20822 Contact with and (suspected) exposure to covid-19: Secondary | ICD-10-CM

## 2019-05-22 DIAGNOSIS — R05 Cough: Secondary | ICD-10-CM

## 2019-05-22 DIAGNOSIS — R0602 Shortness of breath: Secondary | ICD-10-CM

## 2019-05-22 DIAGNOSIS — R519 Headache, unspecified: Secondary | ICD-10-CM

## 2019-05-22 DIAGNOSIS — K591 Functional diarrhea: Secondary | ICD-10-CM

## 2019-05-22 MED ORDER — BENZONATATE 100 MG PO CAPS: 100.0000 mg | ORAL_CAPSULE | Freq: Three times a day (TID) | ORAL | 0 refills | Status: DC | PRN

## 2019-05-22 NOTE — Progress Notes (Signed)
E-Visit for Corona Virus Screening   Your current symptoms could be consistent with the coronavirus.  Many health care providers can now test patients at their office but not all are.  [facility] has multiple testing sites. For information on our COVID testing locations and hours go to [email or web address]  Please quarantine yourself while awaiting your test results.  We are enrolling you in our MyChart Home [facility] for COVID19 . Daily you will receive a questionnaire within the MyChart website. Our COVID 19 response team willl be monitoriing your responses daily. Please continue good preventive care measures, including:  frequent hand-washing, avoid touching your face, cover coughs/sneezes, stay out of crowds and keep a 6 foot distance from others.    COVID-19 is a respiratory illness with symptoms that are similar to the flu. Symptoms are typically mild to moderate, but there have been cases of severe illness and death due to the virus. The following symptoms may appear 2-14 days after exposure: . Fever . Cough . Shortness of breath or difficulty breathing . Chills . Repeated shaking with chills . Muscle pain . Headache . Sore throat . New loss of taste or smell . Fatigue . Congestion or runny nose . Nausea or vomiting . Diarrhea  If you develop fever/cough/breathlessness, please stay home for 10 days with improving symptoms and until you have had 24 hours of no fever (without taking a fever reducer).  Go to the nearest hospital ED for assessment if fever/cough/breathlessness are severe or illness seems like a threat to life.  It is vitally important that if you feel that you have an infection such as this virus or any other virus that you stay home and away from places where you may spread it to others.  You should avoid contact with people age 65 and older.   You should wear a mask or cloth face covering over your nose and mouth if you must be around other  people or animals, including pets (even at home). Try to stay at least 6 feet away from other people. This will protect the people around you.  You can use medication such as A prescription cough medication called Tessalon Perles 100 mg. You may take 1-2 capsules every 8 hours as needed for cough  You may also take acetaminophen (Tylenol) as needed for fever.   Reduce your risk of any infection by using the same precautions used for avoiding the common cold or flu:  . Wash your hands often with soap and warm water for at least 20 seconds.  If soap and water are not readily available, use an alcohol-based hand sanitizer with at least 60% alcohol.  . If coughing or sneezing, cover your mouth and nose by coughing or sneezing into the elbow areas of your shirt or coat, into a tissue or into your sleeve (not your hands). . Avoid shaking hands with others and consider head nods or verbal greetings only. . Avoid touching your eyes, nose, or mouth with unwashed hands.  . Avoid close contact with people who are sick. . Avoid places or events with large numbers of people in one location, like concerts or sporting events. . Carefully consider travel plans you have or are making. . If you are planning any travel outside or inside the [place], visit the CDC's Travelers' Health webpage for the latest health notices. . If you have some symptoms but not all symptoms, continue to monitor at home and seek medical attention if your symptoms worsen. . If   you are having a medical emergency, call 911.  HOME CARE . Only take medications as instructed by your medical team. . Drink plenty of fluids and get plenty of rest. . A steam or ultrasonic humidifier can help if you have congestion.   GET HELP RIGHT AWAY IF YOU HAVE EMERGENCY WARNING SIGNS** FOR COVID-19. If you or someone is showing any of these signs seek emergency medical care immediately. Call 911 or proceed to your closest emergency facility if: . You develop  worsening high fever. . Trouble breathing . Bluish lips or face . Persistent pain or pressure in the chest . New confusion . Inability to wake or stay awake . You cough up blood. . Your symptoms become more severe  **This list is not all possible symptoms. Contact your medical provider for any symptoms that are sever or concerning to you.   MAKE SURE YOU   Understand these instructions.  Will watch your condition.  Will get help right away if you are not doing well or get worse.  Your e-visit answers were reviewed by a board certified advanced clinical practitioner to complete your personal care plan.  Depending on the condition, your plan could have included both over the counter or prescription medications.  If there is a problem please reply once you have received a response from your provider.  Your safety is important to us.  If you have drug allergies check your prescription carefully.    You can use MyChart to ask questions about today's visit, request a non-urgent call back, or ask for a work or school excuse for 24 hours related to this e-Visit. If it has been greater than 24 hours you will need to follow up with your provider, or enter a new e-Visit to address those concerns. You will get an e-mail in the next two days asking about your experience.  I hope that your e-visit has been valuable and will speed your recovery. Thank you for using e-visits.   5-10 minutes spent reviewing and documenting in chart.  

## 2019-05-23 DIAGNOSIS — Z20828 Contact with and (suspected) exposure to other viral communicable diseases: Secondary | ICD-10-CM | POA: Diagnosis not present

## 2019-06-11 DIAGNOSIS — Z13220 Encounter for screening for lipoid disorders: Secondary | ICD-10-CM | POA: Diagnosis not present

## 2019-06-11 DIAGNOSIS — F909 Attention-deficit hyperactivity disorder, unspecified type: Secondary | ICD-10-CM | POA: Diagnosis not present

## 2019-06-11 DIAGNOSIS — Z0000 Encounter for general adult medical examination without abnormal findings: Secondary | ICD-10-CM | POA: Diagnosis not present

## 2019-06-11 DIAGNOSIS — L4050 Arthropathic psoriasis, unspecified: Secondary | ICD-10-CM | POA: Diagnosis not present

## 2019-06-11 DIAGNOSIS — M1990 Unspecified osteoarthritis, unspecified site: Secondary | ICD-10-CM | POA: Diagnosis not present

## 2019-06-20 ENCOUNTER — Other Ambulatory Visit: Payer: Federal, State, Local not specified - PPO

## 2019-06-26 DIAGNOSIS — Z20828 Contact with and (suspected) exposure to other viral communicable diseases: Secondary | ICD-10-CM | POA: Diagnosis not present

## 2019-07-09 DIAGNOSIS — L409 Psoriasis, unspecified: Secondary | ICD-10-CM | POA: Diagnosis not present

## 2019-07-09 DIAGNOSIS — Z0000 Encounter for general adult medical examination without abnormal findings: Secondary | ICD-10-CM | POA: Diagnosis not present

## 2019-07-09 DIAGNOSIS — F909 Attention-deficit hyperactivity disorder, unspecified type: Secondary | ICD-10-CM | POA: Diagnosis not present

## 2019-07-09 DIAGNOSIS — J45990 Exercise induced bronchospasm: Secondary | ICD-10-CM | POA: Diagnosis not present

## 2019-09-18 DIAGNOSIS — Z01419 Encounter for gynecological examination (general) (routine) without abnormal findings: Secondary | ICD-10-CM | POA: Diagnosis not present

## 2019-09-18 DIAGNOSIS — Z6828 Body mass index (BMI) 28.0-28.9, adult: Secondary | ICD-10-CM | POA: Diagnosis not present

## 2019-10-10 DIAGNOSIS — M25562 Pain in left knee: Secondary | ICD-10-CM | POA: Diagnosis not present

## 2019-11-06 DIAGNOSIS — L409 Psoriasis, unspecified: Secondary | ICD-10-CM | POA: Diagnosis not present

## 2019-11-06 DIAGNOSIS — L4059 Other psoriatic arthropathy: Secondary | ICD-10-CM | POA: Diagnosis not present

## 2019-11-06 DIAGNOSIS — Z79899 Other long term (current) drug therapy: Secondary | ICD-10-CM | POA: Diagnosis not present

## 2019-11-06 DIAGNOSIS — Z111 Encounter for screening for respiratory tuberculosis: Secondary | ICD-10-CM | POA: Diagnosis not present

## 2019-11-06 DIAGNOSIS — M2550 Pain in unspecified joint: Secondary | ICD-10-CM | POA: Diagnosis not present

## 2019-11-08 ENCOUNTER — Encounter (HOSPITAL_COMMUNITY): Payer: Self-pay

## 2019-11-26 DIAGNOSIS — R319 Hematuria, unspecified: Secondary | ICD-10-CM | POA: Diagnosis not present

## 2019-11-26 DIAGNOSIS — R102 Pelvic and perineal pain: Secondary | ICD-10-CM | POA: Diagnosis not present

## 2019-11-26 DIAGNOSIS — Z3200 Encounter for pregnancy test, result unknown: Secondary | ICD-10-CM | POA: Diagnosis not present

## 2019-12-05 DIAGNOSIS — R102 Pelvic and perineal pain: Secondary | ICD-10-CM | POA: Diagnosis not present

## 2019-12-05 DIAGNOSIS — N390 Urinary tract infection, site not specified: Secondary | ICD-10-CM | POA: Diagnosis not present

## 2020-01-16 ENCOUNTER — Other Ambulatory Visit: Payer: Self-pay

## 2020-01-16 ENCOUNTER — Ambulatory Visit (INDEPENDENT_AMBULATORY_CARE_PROVIDER_SITE_OTHER): Payer: Federal, State, Local not specified - PPO | Admitting: Mental Health

## 2020-01-16 DIAGNOSIS — F321 Major depressive disorder, single episode, moderate: Secondary | ICD-10-CM

## 2020-01-16 NOTE — Progress Notes (Signed)
[facility] Counselor Initial Adult Exam  Name: Abigail Morales Date: 01/16/2020 MRN: [id number] DOB: 11-07-1985 PCP: Pearson Grippe, MD  Time spent: 53 minutes  Reason for Visit /Presenting Problem: she stated she and her husband started having problems about 3 months ago. She stated she helped him through graduate school [facility] [employer] [facility]) but is not using it as he works in the family business. She has wanted him to leave this business due to his income. She had bariatric surgery 2018, started getting more attention from others. She got the surgery due to some medical issues (arthritis, miniscus tear). Some days she feels very sensitive, cries often, some days "emotionless". Rates her depression about a 4/10, 2 months ago it would have been higher.     Mental Status Exam:   Appearance:   Casual     Behavior:  Appropriate  Motor:  Normal  Speech/Language:   Clear and Coherent  Affect:  constricted  Mood:  depressed  Thought process:  normal  Thought content:    WNL  Sensory/Perceptual disturbances:    WNL  Orientation:  x4  Attention:  Good  Concentration:  Good  Memory:  WNL  Fund of knowledge:   Good  Insight:    Good  Judgment:   Good  Impulse Control:  Good   Reported Symptoms:  Depressed mood, anxiety, lower motivation, tearful / crying spells, some anhedonia  Risk Assessment: Danger to Self:  No Self-injurious Behavior: No Danger to Others: No Duty to Warn:no Physical Aggression / Violence:No  Access to Firearms a concern: No  Gang Involvement:No  Patient / guardian was educated about steps to take if suicide or homicide risk level increases between visits: yes While future psychiatric events cannot be accurately predicted, the patient does not currently require acute inpatient psychiatric care and does not currently meet [facility] involuntary commitment criteria.  Substance Abuse History: Current substance abuse: none  Past Psychiatric History:   Outpatient Providers:  none History of Psych Hospitalization: none Psychological Testing: none  Abuse History: Victim of No., none   Report needed: No. Victim of Neglect:No. Perpetrator of none  Witness / Exposure to Domestic Violence: No   Protective Services Involvement: No  Witness to [employer] Violence:  No   Family History:  Family History  Problem Relation Age of Onset  . Hypertension Other   . Stroke Other   . Heart disease Other   . Diabetes Other     Living situation: the patient lives w/ husband Sexual Orientation:  hetero  Relationship Status: married   Name of spouse / other:  Maisie Fus              If a parent, number of children / ages: none  Support Systems; family, friends  Surveyor, quantity Stress:  none  Income/Employment/Disability:  Public affairs consultant: No   Educational History: Education: some college  Religion/Sprituality/World View:   none  Any cultural differences that may affect / interfere with treatment:  none  Recreation/Hobbies: buy [employer], shooting range, time w/ friends  Stressors: marital  Strengths: employment, support system  Barriers:  none  Legal History: Pending legal issue / charges: none History of legal issue / charges: none  Medical History/Surgical History: Past Medical History:  Diagnosis Date  . Arthritis    left knee  . Asthma    in past, none now  . Complication of anesthesia    left knee does not go straight due to arthritis please position carefully, uses pilloe to  position at hs  . GERD (gastroesophageal reflux disease)    no recent meds  . Morbid obesity (HCC)     Past Surgical History:  Procedure Laterality Date  . LAPAROSCOPIC GASTRIC SLEEVE RESECTION N/A 04/18/2017   Procedure: LAPAROSCOPIC GASTRIC SLEEVE RESECTION, UPPER ENDO;  Surgeon: Berna Bue, MD;  Location: [facility];  Service: General;  Laterality: N/A;  . left knee meniscus and partial synovectomy  12/23/2016    Medications: Current  Outpatient Medications  Medication Sig Dispense Refill  . acetaminophen (TYLENOL) 500 MG tablet Take 1,000 mg by mouth every 6 (six) hours as needed for mild pain, moderate pain or headache.     . albuterol (PROVENTIL HFA;VENTOLIN HFA) 108 (90 Base) MCG/ACT inhaler Inhale 1-2 puffs into the lungs every 6 (six) hours as needed for wheezing or shortness of breath.    . amphetamine-dextroamphetamine (ADDERALL) 10 MG tablet Take 10-20 mg by mouth 2 (two) times daily with a meal. Takes 20mg  in the AM and 10mg  in the afternoon  0  . benzonatate (TESSALON PERLES) 100 MG capsule Take 1 capsule (100 mg total) by mouth 3 (three) times daily as needed. 20 capsule 0  . calcium carbonate (OS-CAL - DOSED IN MG OF ELEMENTAL CALCIUM) 1250 (500 Ca) MG tablet Take 500 tablets by mouth 3 (three) times daily with meals.    esomeprazole (NEXIUM) 40 MG capsule Take 1 capsule (40 mg total) by mouth daily. Prescription given at preop appointment 30 capsule 1  . etanercept (ENBREL) 50 MG/ML injection Inject 50 mg into the skin every Tuesday.    Marland Kitchen MICROGESTIN 1-20 MG-MCG tablet Take 1 tablet by mouth at bedtime. Patient told by dr Marland Kitchen ok to continue oral contraceptives  2  . Multiple Vitamin (MULTIVITAMIN WITH MINERALS) TABS tablet Take 1 tablet by mouth daily.    . Multiple Vitamin (MULTIVITAMIN) tablet Take 1 tablet by mouth daily. Patient is taking multivitamin pack which are preop vitamins- vit b 12- 500 mcg, vitamin A 5000IU , citamin C 90 mg,vitamin D 800 Iu , vitamin E 45 IU vitamin K 160mg , vitamin B6 4 mg, Folate 800 mcg, Calcium 600mg     . ondansetron (ZOFRAN ODT) 4 MG disintegrating tablet Take 1 tablet (4 mg total) by mouth every 8 (eight) hours as needed for nausea or vomiting. Prescription given at preop appointment 20 tablet 0  . oxyCODONE (ROXICODONE) 5 MG/5ML solution Take 5 mLs (5 mg total) by mouth every 6 (six) hours as needed for severe pain. Prescription given at preop appointment  0  . protein  supplement shake (PREMIER PROTEIN) LIQD Take 2 oz by mouth daily.    . RaNITidine HCl (ZANTAC PO) Take 1 tablet by mouth at bedtime as needed (for acid reflex).     No current facility-administered medications for this visit.    Allergies  Allergen Reactions  . Robitussin (Alcohol Free) [Guaifenesin] Other (See Comments)    Hallucinations  . Sertraline Other (See Comments)    Chest pain    Diagnoses:    ICD-10-CM   1. MDD (major depressive disorder), single episode, moderate (HCC)  F32.1     Plan of Care: TBD   09-24-2005, [facility]

## 2020-01-21 DIAGNOSIS — Z79899 Other long term (current) drug therapy: Secondary | ICD-10-CM | POA: Diagnosis not present

## 2020-01-21 DIAGNOSIS — L4050 Arthropathic psoriasis, unspecified: Secondary | ICD-10-CM | POA: Diagnosis not present

## 2020-01-21 DIAGNOSIS — F419 Anxiety disorder, unspecified: Secondary | ICD-10-CM | POA: Diagnosis not present

## 2020-01-21 DIAGNOSIS — Z5181 Encounter for therapeutic drug level monitoring: Secondary | ICD-10-CM | POA: Diagnosis not present

## 2020-01-21 DIAGNOSIS — Z0000 Encounter for general adult medical examination without abnormal findings: Secondary | ICD-10-CM | POA: Diagnosis not present

## 2020-01-21 DIAGNOSIS — R739 Hyperglycemia, unspecified: Secondary | ICD-10-CM | POA: Diagnosis not present

## 2020-01-31 ENCOUNTER — Ambulatory Visit: Payer: Federal, State, Local not specified - PPO | Admitting: Mental Health

## 2020-02-07 DIAGNOSIS — L4059 Other psoriatic arthropathy: Secondary | ICD-10-CM | POA: Diagnosis not present

## 2020-02-07 DIAGNOSIS — Z79899 Other long term (current) drug therapy: Secondary | ICD-10-CM | POA: Diagnosis not present

## 2020-02-07 DIAGNOSIS — M2550 Pain in unspecified joint: Secondary | ICD-10-CM | POA: Diagnosis not present

## 2020-02-07 DIAGNOSIS — L409 Psoriasis, unspecified: Secondary | ICD-10-CM | POA: Diagnosis not present

## 2020-02-13 ENCOUNTER — Ambulatory Visit: Payer: Federal, State, Local not specified - PPO | Admitting: Mental Health

## 2020-02-19 DIAGNOSIS — F909 Attention-deficit hyperactivity disorder, unspecified type: Secondary | ICD-10-CM | POA: Diagnosis not present

## 2020-03-02 DIAGNOSIS — Z20828 Contact with and (suspected) exposure to other viral communicable diseases: Secondary | ICD-10-CM | POA: Diagnosis not present

## 2020-03-06 ENCOUNTER — Ambulatory Visit (INDEPENDENT_AMBULATORY_CARE_PROVIDER_SITE_OTHER): Payer: Federal, State, Local not specified - PPO | Admitting: Mental Health

## 2020-03-06 ENCOUNTER — Other Ambulatory Visit: Payer: Self-pay

## 2020-03-06 DIAGNOSIS — F321 Major depressive disorder, single episode, moderate: Secondary | ICD-10-CM | POA: Diagnosis not present

## 2020-03-06 NOTE — Progress Notes (Signed)
[facility] Counsel[facility] Psychotherapy Note  Name: Meher Kucinski Date: 03/06/2020 MRN: [id number] DOB: Jul 13, 1985 PCP: Pearson Grippe, MD  Time spent: 55 minutes  Treatment:   Ind. Therapy  Mental Status Exam:   Appearance:   Casual     Behavior:  Appropriate  Motor:  Normal  Speech/Language:   Clear and Coherent  Affect:  constricted  Mood:  depressed  Thought process:  normal  Thought content:    WNL  Sensory/Perceptual disturbances:    WNL  Orientation:  x4  Attention:  Good  Concentration:  Good  Memory:  WNL  Fund of knowledge:   Good  Insight:    Good  Judgment:   Good  Impulse Control:  Good   Reported Symptoms:  Depressed mood, anxiety, lower motivation, tearful / crying spells, some anhedonia  Medical History/Surgical History: Past Medical History:  Diagnosis Date  . Arthritis    left knee  . Asthma    in past, none now  . Complication of anesthesia    left knee does not go straight due to arthritis please position carefully, uses pilloe to position at hs  . GERD (gastroesophageal reflux disease)    no recent meds  . Morbid obesity (HCC)     Past Surgical History:  Procedure Laterality Date  . LAPAROSCOPIC GASTRIC SLEEVE RESECTION N/A 04/18/2017   Procedure: LAPAROSCOPIC GASTRIC SLEEVE RESECTION, UPPER ENDO;  Surgeon: Berna Bue, MD;  Location: [facility];  Service: General;  Laterality: N/A;  . left knee meniscus and partial synovectomy  12/23/2016    Medications: Current Outpatient Medications  Medication Sig Dispense Refill  . acetaminophen (TYLENOL) 500 MG tablet Take 1,000 mg by mouth every 6 (six) hours as needed for mild pain, moderate pain or headache.     . albuterol (PROVENTIL HFA;VENTOLIN HFA) 108 (90 Base) MCG/ACT inhaler Inhale 1-2 puffs into the lungs every 6 (six) hours as needed for wheezing or shortness of breath.    . amphetamine-dextroamphetamine (ADDERALL) 10 MG tablet Take 10-20 mg by mouth 2 (two) times daily with a meal. Takes  20mg  in the AM and 10mg  in the afternoon  0  . benzonatate (TESSALON PERLES) 100 MG capsule Take 1 capsule (100 mg total) by mouth 3 (three) times daily as needed. 20 capsule 0  . calcium carbonate (OS-CAL - DOSED IN MG OF ELEMENTAL CALCIUM) 1250 (500 Ca) MG tablet Take 500 tablets by mouth 3 (three) times daily with meals.    esomeprazole (NEXIUM) 40 MG capsule Take 1 capsule (40 mg total) by mouth daily. Prescription given at preop appointment 30 capsule 1  . etanercept (ENBREL) 50 MG/ML injection Inject 50 mg into the skin every Tuesday.    Marland Kitchen MICROGESTIN 1-20 MG-MCG tablet Take 1 tablet by mouth at bedtime. Patient told by dr Marland Kitchen ok to continue oral contraceptives  2  . Multiple Vitamin (MULTIVITAMIN WITH MINERALS) TABS tablet Take 1 tablet by mouth daily.    . Multiple Vitamin (MULTIVITAMIN) tablet Take 1 tablet by mouth daily. Patient is taking multivitamin pack which are preop vitamins- vit b 12- 500 mcg, vitamin A 5000IU , citamin C 90 mg,vitamin D 800 Iu , vitamin E 45 IU vitamin K 160mg , vitamin B6 4 mg, Folate 800 mcg, Calcium 600mg     . ondansetron (ZOFRAN ODT) 4 MG disintegrating tablet Take 1 tablet (4 mg total) by mouth every 8 (eight) hours as needed for nausea or vomiting. Prescription given at preop appointment 20 tablet 0  . oxyCODONE (ROXICODONE)  5 MG/5ML solution Take 5 mLs (5 mg total) by mouth every 6 (six) hours as needed for severe pain. Prescription given at preop appointment  0  . protein supplement shake (PREMIER PROTEIN) LIQD Take 2 oz by mouth daily.    . RaNITidine HCl (ZANTAC PO) Take 1 tablet by mouth at bedtime as needed (for acid reflex).     No current facility-administered medications for this visit.    Allergies  Allergen Reactions  . Robitussin (Alcohol Free) [Guaifenesin] Other (See Comments)    Hallucinations  . Sertraline Other (See Comments)    Chest pain    Subjective:  Patient presents for session on time.  Continue to assess needs as well as  progress since last visit.  She stated that she had been somewhat more optimistic regarding her husband looking for a job as he had applied at one recently.  She stated that he does not appear to have ongoing motivation however, has not been persistent in her review.  Through guided discovery, she identified how she feels she is changed over the last 2 years, "I feel like he continues to expect me to be exactly the person that I was".  She went on to elaborate referring to how she often did not verbalize her thoughts or opinions, trying to "keep the peace", focus on paying bills and keeping up with the house etc.  She stated that she wants him to validate her opinions when she gets them now and she feels that he has not and does not appear to be trying to adjust.  She feels she is trying to better herself, strive through her career while also interpersonally validating herself more in the relationship.  She feels he does not have enough aspirations to make changes with his career, appears to be complacent in her view.  Assisted her in identifying these thoughts and feelings today while facilitating increased steps toward self insight through guided discovery.  Intervention:  CBT, supportive therapy, solution focused therapy  Diagnoses:    ICD-10-CM   1. MDD (major depressive disorder), single episode, moderate (HCC)  F32.1     Plan: Patient to continue to work on effectively communicating her needs in her marriage. Patient to maintain outlets for her stress via exercise and using her support system.   Long-term goal:  Reduce overall level, frequency, and intensityof the feelings of depression and anxiety 7/10 to a 0-2/10 in severity for at least 3 consecutive months.  Short-term goal:  Patient to utilize coping skills as discussed in session Decrease thoughts that increase feelings of anxiety and depression Patient to continue to express needs in her marital relationship Patient to continue to  utilize her support system as needed. Patient to continue to engage in pleasurable activities such as spending time with her close friends Patient to engage in psychiatric visit and report any medication concerns as needed  Assessment of progress:  progressing   Waldron Session, [facility]

## 2020-03-20 ENCOUNTER — Other Ambulatory Visit: Payer: Self-pay

## 2020-03-20 ENCOUNTER — Ambulatory Visit (INDEPENDENT_AMBULATORY_CARE_PROVIDER_SITE_OTHER): Payer: Federal, State, Local not specified - PPO | Admitting: Mental Health

## 2020-03-20 DIAGNOSIS — F321 Major depressive disorder, single episode, moderate: Secondary | ICD-10-CM | POA: Diagnosis not present

## 2020-03-20 DIAGNOSIS — F909 Attention-deficit hyperactivity disorder, unspecified type: Secondary | ICD-10-CM | POA: Diagnosis not present

## 2020-03-20 NOTE — Progress Notes (Signed)
[facility] Counsel[facility] Psychotherapy Note  Name: Abigail Morales Date: 03/20/2020 MRN: [id number] DOB: 15-Sep-1985 PCP: Pearson Grippe, MD  Time spent: 56 minutes  Treatment:   Ind. Therapy  Mental Status Exam:   Appearance:   Casual     Behavior:  Appropriate  Motor:  Normal  Speech/Language:   Clear and Coherent  Affect:  constricted  Mood:  depressed, pleasant  Thought process:  normal  Thought content:    WNL  Sensory/Perceptual disturbances:    WNL  Orientation:  x4  Attention:  Good  Concentration:  Good  Memory:  WNL  Fund of knowledge:   Good  Insight:    Good  Judgment:   Good  Impulse Control:  Good   Reported Symptoms:  Depressed mood, anxiety, lower motivation, tearful / crying spells, some anhedonia, probs w/ focus/attention, distractible  Medical History/Surgical History: Past Medical History:  Diagnosis Date   Arthritis    left knee   Asthma    in past, none now   Complication of anesthesia    left knee does not go straight due to arthritis please position carefully, uses pilloe to position at hs   GERD (gastroesophageal reflux disease)    no recent meds   Morbid obesity (HCC)     Past Surgical History:  Procedure Laterality Date   LAPAROSCOPIC GASTRIC SLEEVE RESECTION N/A 04/18/2017   Procedure: LAPAROSCOPIC GASTRIC SLEEVE RESECTION, UPPER ENDO;  Surgeon: Berna Bue, MD;  Location: [facility];  Service: General;  Laterality: N/A;   left knee meniscus and partial synovectomy  12/23/2016    Medications: Current Outpatient Medications  Medication Sig Dispense Refill   acetaminophen (TYLENOL) 500 MG tablet Take 1,000 mg by mouth every 6 (six) hours as needed for mild pain, moderate pain or headache.      albuterol (PROVENTIL HFA;VENTOLIN HFA) 108 (90 Base) MCG/ACT inhaler Inhale 1-2 puffs into the lungs every 6 (six) hours as needed for wheezing or shortness of breath.     amphetamine-dextroamphetamine (ADDERALL) 10 MG tablet Take 10-20 mg  by mouth 2 (two) times daily with a meal. Takes 20mg  in the AM and 10mg  in the afternoon  0   benzonatate (TESSALON PERLES) 100 MG capsule Take 1 capsule (100 mg total) by mouth 3 (three) times daily as needed. 20 capsule 0   calcium carbonate (OS-CAL - DOSED IN MG OF ELEMENTAL CALCIUM) 1250 (500 Ca) MG tablet Take 500 tablets by mouth 3 (three) times daily with meals.     esomeprazole (NEXIUM) 40 MG capsule Take 1 capsule (40 mg total) by mouth daily. Prescription given at preop appointment 30 capsule 1   etanercept (ENBREL) 50 MG/ML injection Inject 50 mg into the skin every Tuesday.     MICROGESTIN 1-20 MG-MCG tablet Take 1 tablet by mouth at bedtime. Patient told by dr Wednesday ok to continue oral contraceptives  2   Multiple Vitamin (MULTIVITAMIN WITH MINERALS) TABS tablet Take 1 tablet by mouth daily.     Multiple Vitamin (MULTIVITAMIN) tablet Take 1 tablet by mouth daily. Patient is taking multivitamin pack which are preop vitamins- vit b 12- 500 mcg, vitamin A 5000IU , citamin C 90 mg,vitamin D 800 Iu , vitamin E 45 IU vitamin K 160mg , vitamin B6 4 mg, Folate 800 mcg, Calcium 600mg      ondansetron (ZOFRAN ODT) 4 MG disintegrating tablet Take 1 tablet (4 mg total) by mouth every 8 (eight) hours as needed for nausea or vomiting. Prescription given at preop appointment 20 tablet  0   oxyCODONE (ROXICODONE) 5 MG/5ML solution Take 5 mLs (5 mg total) by mouth every 6 (six) hours as needed for severe pain. Prescription given at preop appointment  0   protein supplement shake (PREMIER PROTEIN) LIQD Take 2 oz by mouth daily.     RaNITidine HCl (ZANTAC PO) Take 1 tablet by mouth at bedtime as needed (for acid reflex).     No current facility-administered medications for this visit.    Allergies  Allergen Reactions   Robitussin (Alcohol Free) [Guaifenesin] Other (See Comments)    Hallucinations   Sertraline Other (See Comments)    Chest pain    Subjective:  Patient presents for  session on time. She shared how she continues to have marital stress, going on to share some details about some recent arguments she and her husband have had. She stated that he has a tendency to get upset and leave during arguments. She stated that he does not return later to discuss the disagreement, that she typically has been the one who will apologize or attempt to try and process the issue. She continues to feel frustrated with his not looking for other employment options, she feels he is somewhat complacent, not wanting to try to make changes to "better himself" referring to finding a different job with more income as she feels he is Designer, television/film set for his current job as he has an Set designer. She feels he continues to stay at this job due to family ties. She would also like to start a family, but she worries about finances as he has much lower income than she does and feels that she will have to be primarily financially responsible if they have a child as she is financially responsible for most of their bills currently. At this point, she expressed having low motivation to engage in couples counseling rather possibly time separated to reflect on identifying her needs and future. She stated she plans to talk with her close friend about staying with her and her husband for a few days. She plans to talk with her husband later about her decision. She shared how she has a history of diagnosis of ADHD, takes Adderall when at work to help with focus and concentration. She shared how she is coped with anxiety for many years and continues to presently. We recommend she engage in psychiatric assessment at our practice to discuss options.  Intervention:  CBT, supportive therapy, solution focused therapy  Diagnoses:    ICD-10-CM   1. MDD (major depressive disorder), single episode, moderate (HCC)  F32.1   2. Attention deficit hyperactivity disorder (ADHD), unspecified ADHD type  F90.9     Plan: Patient is to use CBT,  mindfulness and coping skills to help decrease symptoms associated with their diagnosis. Patient to continue to work on her communicating needs and her marital relationship. Patient to follow-up with psychiatric assessment.   Long-term goal:   Reduce overall level, frequency, and intensity of the feelings of depression and anxiety 7/10 to a 0-2/10 in severity for at least 3 consecutive months.   Short-term goal:  Patient to utilize coping skills as discussed in session Decrease thoughts that increase feelings of anxiety and depression Patient to continue to express needs in her marital relationship Patient to continue to utilize her support system as needed. Patient to continue to engage in pleasurable activities such as spending time with her close friends Patient to engage in psychiatric visit and report any medication concerns as needed   Assessment  of progress:  progressing    Waldron Session, [facility]

## 2020-04-02 ENCOUNTER — Ambulatory Visit (INDEPENDENT_AMBULATORY_CARE_PROVIDER_SITE_OTHER): Payer: Federal, State, Local not specified - PPO | Admitting: Mental Health

## 2020-04-02 ENCOUNTER — Other Ambulatory Visit: Payer: Self-pay

## 2020-04-02 DIAGNOSIS — F411 Generalized anxiety disorder: Secondary | ICD-10-CM | POA: Diagnosis not present

## 2020-04-02 DIAGNOSIS — F321 Major depressive disorder, single episode, moderate: Secondary | ICD-10-CM

## 2020-04-02 DIAGNOSIS — F909 Attention-deficit hyperactivity disorder, unspecified type: Secondary | ICD-10-CM

## 2020-04-02 NOTE — Progress Notes (Signed)
[facility] Counsel[facility] Psychotherapy Note  Name: Danessa Mensch Date: 04/02/2020 MRN: [id number] DOB: 1985/08/28 PCP: Pearson Grippe, MD  Time spent: 56 minutes  Treatment:   Ind. Therapy  Mental Status Exam:   Appearance:   Casual     Behavior:  Appropriate  Motor:  Normal  Speech/Language:   Clear and Coherent  Affect:  constricted  Mood:  depressed, pleasant  Thought process:  normal  Thought content:    WNL  Sensory/Perceptual disturbances:    WNL  Orientation:  x4  Attention:  Good  Concentration:  Good  Memory:  WNL  Fund of knowledge:   Good  Insight:    Good  Judgment:   Good  Impulse Control:  Good   Reported Symptoms:  Depressed mood, anxiety, lower motivation, tearful / crying spells, some anhedonia, probs w/ focus/attention, distractible  Medical History/Surgical History: Past Medical History:  Diagnosis Date  . Arthritis    left knee  . Asthma    in past, none now  . Complication of anesthesia    left knee does not go straight due to arthritis please position carefully, uses pilloe to position at hs  . GERD (gastroesophageal reflux disease)    no recent meds  . Morbid obesity (HCC)     Past Surgical History:  Procedure Laterality Date  . LAPAROSCOPIC GASTRIC SLEEVE RESECTION N/A 04/18/2017   Procedure: LAPAROSCOPIC GASTRIC SLEEVE RESECTION, UPPER ENDO;  Surgeon: Berna Bue, MD;  Location: [facility];  Service: General;  Laterality: N/A;  . left knee meniscus and partial synovectomy  12/23/2016    Medications: Current Outpatient Medications  Medication Sig Dispense Refill  . acetaminophen (TYLENOL) 500 MG tablet Take 1,000 mg by mouth every 6 (six) hours as needed for mild pain, moderate pain or headache.     . albuterol (PROVENTIL HFA;VENTOLIN HFA) 108 (90 Base) MCG/ACT inhaler Inhale 1-2 puffs into the lungs every 6 (six) hours as needed for wheezing or shortness of breath.    . amphetamine-dextroamphetamine (ADDERALL) 10 MG tablet Take 10-20 mg  by mouth 2 (two) times daily with a meal. Takes 20mg  in the AM and 10mg  in the afternoon  0  . benzonatate (TESSALON PERLES) 100 MG capsule Take 1 capsule (100 mg total) by mouth 3 (three) times daily as needed. 20 capsule 0  . calcium carbonate (OS-CAL - DOSED IN MG OF ELEMENTAL CALCIUM) 1250 (500 Ca) MG tablet Take 500 tablets by mouth 3 (three) times daily with meals.    esomeprazole (NEXIUM) 40 MG capsule Take 1 capsule (40 mg total) by mouth daily. Prescription given at preop appointment 30 capsule 1  . etanercept (ENBREL) 50 MG/ML injection Inject 50 mg into the skin every Tuesday.    Marland Kitchen MICROGESTIN 1-20 MG-MCG tablet Take 1 tablet by mouth at bedtime. Patient told by dr Marland Kitchen ok to continue oral contraceptives  2  . Multiple Vitamin (MULTIVITAMIN WITH MINERALS) TABS tablet Take 1 tablet by mouth daily.    . Multiple Vitamin (MULTIVITAMIN) tablet Take 1 tablet by mouth daily. Patient is taking multivitamin pack which are preop vitamins- vit b 12- 500 mcg, vitamin A 5000IU , citamin C 90 mg,vitamin D 800 Iu , vitamin E 45 IU vitamin K 160mg , vitamin B6 4 mg, Folate 800 mcg, Calcium 600mg     . ondansetron (ZOFRAN ODT) 4 MG disintegrating tablet Take 1 tablet (4 mg total) by mouth every 8 (eight) hours as needed for nausea or vomiting. Prescription given at preop appointment 20 tablet  0  . oxyCODONE (ROXICODONE) 5 MG/5ML solution Take 5 mLs (5 mg total) by mouth every 6 (six) hours as needed for severe pain. Prescription given at preop appointment  0  . protein supplement shake (PREMIER PROTEIN) LIQD Take 2 oz by mouth daily.    . RaNITidine HCl (ZANTAC PO) Take 1 tablet by mouth at bedtime as needed (for acid reflex).     No current facility-administered medications for this visit.    Allergies  Allergen Reactions  . Robitussin (Alcohol Free) [Guaifenesin] Other (See Comments)    Hallucinations  . Sertraline Other (See Comments)    Chest pain    Subjective:  Patient presents patient  presents for session on time.  She shared changes, how she and her husband separated about 2 weeks ago.  She shared how she communicated the need to separate to him and how he responded and has since that time.  She stated that he continues to appear angry and resentful.  She stated that he has taken some steps toward looking for a new job as this was something that she had discussed with him for over the past year.  She stated that she needs to time while separated to decrease her stress as she shared that going home as often stressful and emotional.  She identified thoughts and feelings related to this decision.  She shared how she has changed over time, continuing to want to express her opinions and feel that she has an equal partner in various aspects of the relationship.  She questions if this is possible at this point. She stated that she copes with anxiety daily, "I tend to be obsessive about some things"; she stated that she often has difficulty relaxing and likes her home to be organized and well kept.  Gave her homework assignment to identify specific areas of her life with which she feels her tendency to obsess manifest.  Intervention:  CBT, supportive therapy, solution focused therapy  Diagnoses:    ICD-10-CM   1. MDD (major depressive disorder), single episode, moderate (HCC)  F32.1   2. Attention deficit hyperactivity disorder (ADHD), unspecified ADHD type  F90.9   3. GAD (generalized anxiety disorder)  F41.1     Plan: Patient is to use CBT, mindfulness and coping skills to help decrease symptoms associated with their diagnosis. Patient to continue to work on her communicating needs and her marital relationship. Patient to follow-up with psychiatric assessment.   Long-term goal:   Reduce overall level, frequency, and intensity of the feelings of depression and anxiety 7/10 to a 0-2/10 in severity for at least 3 consecutive months.   Short-term goal:  Patient to utilize coping skills  as discussed in session Decrease thoughts that increase feelings of anxiety and depression Patient to continue to express needs in her marital relationship Patient to continue to utilize her support system as needed. Patient to continue to engage in pleasurable activities such as spending time with her close friends Patient to engage in psychiatric visit and report any medication concerns as needed   Assessment of progress:  progressing    Waldron Session, [facility]

## 2020-04-20 ENCOUNTER — Ambulatory Visit: Payer: Federal, State, Local not specified - PPO | Admitting: Mental Health

## 2020-04-20 ENCOUNTER — Ambulatory Visit: Payer: Federal, State, Local not specified - PPO | Admitting: Psychiatry

## 2020-04-20 DIAGNOSIS — Z30011 Encounter for initial prescription of contraceptive pills: Secondary | ICD-10-CM | POA: Diagnosis not present

## 2020-04-20 DIAGNOSIS — Z3009 Encounter for other general counseling and advice on contraception: Secondary | ICD-10-CM | POA: Diagnosis not present

## 2020-04-27 ENCOUNTER — Encounter: Payer: Self-pay | Admitting: Adult Health

## 2020-04-27 ENCOUNTER — Other Ambulatory Visit: Payer: Self-pay

## 2020-04-27 ENCOUNTER — Ambulatory Visit (INDEPENDENT_AMBULATORY_CARE_PROVIDER_SITE_OTHER): Payer: Federal, State, Local not specified - PPO | Admitting: Adult Health

## 2020-04-27 VITALS — BP 121/80 | HR 67 | Ht 66.0 in | Wt 175.0 lb

## 2020-04-27 DIAGNOSIS — F909 Attention-deficit hyperactivity disorder, unspecified type: Secondary | ICD-10-CM

## 2020-04-27 DIAGNOSIS — F411 Generalized anxiety disorder: Secondary | ICD-10-CM | POA: Diagnosis not present

## 2020-04-27 DIAGNOSIS — F428 Other obsessive-compulsive disorder: Secondary | ICD-10-CM | POA: Diagnosis not present

## 2020-04-27 DIAGNOSIS — F321 Major depressive disorder, single episode, moderate: Secondary | ICD-10-CM

## 2020-04-27 MED ORDER — SERTRALINE HCL 50 MG PO TABS: 50.0000 mg | ORAL_TABLET | Freq: Every day | ORAL | 5 refills | Status: DC

## 2020-04-27 NOTE — Progress Notes (Signed)
[facility] MD/PA/NP Initial Note  04/27/2020 10:34 AM Abigail Morales  MRN:  [id number]  Chief Complaint:   HPI:   Describes mood today as "ok". Tearful at times. Pleasant. Mood symptoms - reports depression, anxiety, and irritability.  Things worse with separation. Feels like she and husband will divorce. Plans to sell the house. Worries about "any and everything". Stating "my whole routine has changed". Also stating "it's exhausting". Likes to be organized. Living with best friend for past 2 months while separated. Stable interest and motivation. Taking medications as prescribed.  Energy levels stable. Active, does not have a regular exercise routine - 4 to 5 times a week.  Enjoys some usual interests and activities. Married x 5, together 15 years, but separated (2 months) - has 2 dogs. Sister local. Mother in [place]. Father in [place]. Spending time with family. Appetite adequate. Weight stable 175 pounds. Sleeps well most nights. Averages 6 to 8 hours. Focus and concentration stable. Completing tasks. Managing aspects of household. 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 Graduated from [employer]. Denies SI or HI.  Denies AH or VH.  Previous medication trials: Adderall 5mg  daily  Embrel - PA  Visit Diagnosis:    ICD-10-CM   1. MDD (major depressive disorder), single episode, moderate (HCC)  F32.1   2. Attention deficit hyperactivity disorder (ADHD), unspecified ADHD type  F90.9   3. GAD (generalized anxiety disorder)  F41.1   4. Obsessional thoughts  F42.8     Past Psychiatric History: Denies psychiatric hospitalization.  Past Medical History:  Past Medical History:  Diagnosis Date   Arthritis    left knee   Asthma    in past, none now   Complication of anesthesia    left knee does not go straight due to arthritis please position carefully, uses pilloe to position at hs   GERD (gastroesophageal reflux disease)    no recent meds   Morbid obesity [facility])     Past  Surgical History:  Procedure Laterality Date   LAPAROSCOPIC GASTRIC SLEEVE RESECTION N/A 04/18/2017   Procedure: LAPAROSCOPIC GASTRIC SLEEVE RESECTION, UPPER ENDO;  Surgeon: 04/20/2017, MD;  Location: [facility];  Service: General;  Laterality: N/A;   left knee meniscus and partial synovectomy  12/23/2016    Family Psychiatric History: Mother with depression.  Family History:  Family History  Problem Relation Age of Onset   Hypertension Other    Stroke Other    Heart disease Other    Diabetes Other     Social History:  Social History   Socioeconomic History   Marital status: Married    Spouse name: Not on file   Number of children: Not on file   Years of education: Not on file   Highest education level: Not on file  Occupational History   Not on file  Tobacco Use   Smoking status: Never Smoker   Smokeless tobacco: Never Used  Vaping Use   Vaping Use: Never used  Substance and Sexual Activity   Alcohol use: Yes    Comment: rare wine use none recent   Drug use: No   Sexual activity: Yes  Other Topics Concern   Not on file  Social History Narrative   Not on file   Social Determinants of Health   Financial Resource Strain:    Difficulty of Paying Living Expenses: Not on file  Food Insecurity:    Worried About Running Out of Food in the Last Year: Not on file  Ran Out of Food in the Last Year: Not on file  Transportation Needs:    Lack of Transportation (Medical): Not on file   Lack of Transportation (Non-Medical): Not on file  Physical Activity:    Days of Exercise per Week: Not on file   Minutes of Exercise per Session: Not on file  Stress:    Feeling of Stress : Not on file  Social Connections:    Frequency of Communication with Friends and Family: Not on file   Frequency of Social Gatherings with Friends and Family: Not on file   Attends Religious Services: Not on file   Active Member of Clubs or Organizations: Not on  file   Attends Banker Meetings: Not on file   Marital Status: Not on file    Allergies:  Allergies  Allergen Reactions   Robitussin (Alcohol Free) [Guaifenesin] Other (See Comments)    Hallucinations    Metabolic Disorder Labs: No results found for: HGBA1C, MPG No results found for: PROLACTIN No results found for: CHOL, TRIG, HDL, CHOLHDL, VLDL, LDLCALC No results found for: TSH  Therapeutic Level Labs: No results found for: LITHIUM No results found for: VALPROATE No components found for:  CBMZ  Current Medications: Current Outpatient Medications  Medication Sig Dispense Refill   albuterol (PROVENTIL HFA;VENTOLIN HFA) 108 (90 Base) MCG/ACT inhaler Inhale 1-2 puffs into the lungs every 6 (six) hours as needed for wheezing or shortness of breath.     amphetamine-dextroamphetamine (ADDERALL) 10 MG tablet Take 10-20 mg by mouth 2 (two) times daily with a meal. Takes 20mg  in the AM and 10mg  in the afternoon  0   calcium carbonate (OS-CAL - DOSED IN MG OF ELEMENTAL CALCIUM) 1250 (500 Ca) MG tablet Take 500 tablets by mouth 3 (three) times daily with meals.     etanercept (ENBREL) 50 MG/ML injection Inject 50 mg into the skin every Tuesday.     Multiple Vitamin (MULTIVITAMIN WITH MINERALS) TABS tablet Take 1 tablet by mouth daily.     RaNITidine HCl (ZANTAC PO) Take 1 tablet by mouth at bedtime as needed (for acid reflex).     sertraline (ZOLOFT) 50 MG tablet Take 1 tablet (50 mg total) by mouth daily. 30 tablet 5   No current facility-administered medications for this visit.    Medication Side Effects: none  Orders placed this visit:  No orders of the defined types were placed in this encounter.   Psychiatric Specialty Exam:  Review of Systems  Musculoskeletal: Negative for gait problem.  Neurological: Negative for tremors.  Psychiatric/Behavioral:       Please refer to HPI    There were no vitals taken for this visit.There is no height or weight on  file to calculate BMI.  General Appearance: Casual, Neat and Well Groomed  Eye Contact:  Good  Speech:  Clear and Coherent and Normal Rate  Volume:  Normal  Mood:  Anxious, Depressed and Irritable  Affect:  Appropriate and Congruent  Thought Process:  Coherent and Descriptions of Associations: Intact  Orientation:  Full (Time, Place, and Person)  Thought Content: Logical   Suicidal Thoughts:  No  Homicidal Thoughts:  No  Memory:  WNL  Judgement:  Good  Insight:  Good  Psychomotor Activity:  Normal  Concentration:  Concentration: Good  Recall:  Good  Fund of Knowledge: Good  Language: Good  Assets:  Communication Skills Desire for Improvement Financial Resources/Insurance Housing Intimacy Leisure Time Physical Health Resilience Social Support  ADL's:  Intact  Cognition: WNL  Prognosis:  Good   Screenings:  PHQ2-9     Nutrition from 01/11/2018 [facility] Nutrition from 10/12/2017 [facility] Nutrition from 06/13/2017 [facility] Nutrition from 01/30/2017 in [facility] Nutrition from 12/26/2016 in [facility]  PHQ-2 Total Score 0 0 0 0 0      Receiving Psychotherapy: Yes   Treatment Plan/Recommendations:   Plan:  PDMP reviewed  1. Zoloft 50mg  daily 2. Adderall 10mg  daily - 90 day supply - none needed this visit   Read and reviewed note with patient for accuracy.   RTC 4 weeks  Patient advised to contact office with any questions, adverse effects, or acute worsening in signs and symptoms.   , NP

## 2020-05-04 ENCOUNTER — Ambulatory Visit: Payer: Federal, State, Local not specified - PPO | Admitting: Psychiatry

## 2020-05-04 ENCOUNTER — Ambulatory Visit (INDEPENDENT_AMBULATORY_CARE_PROVIDER_SITE_OTHER): Payer: Federal, State, Local not specified - PPO | Admitting: Mental Health

## 2020-05-04 ENCOUNTER — Other Ambulatory Visit: Payer: Self-pay

## 2020-05-04 DIAGNOSIS — F321 Major depressive disorder, single episode, moderate: Secondary | ICD-10-CM | POA: Diagnosis not present

## 2020-05-04 DIAGNOSIS — F428 Other obsessive-compulsive disorder: Secondary | ICD-10-CM | POA: Diagnosis not present

## 2020-05-04 DIAGNOSIS — F909 Attention-deficit hyperactivity disorder, unspecified type: Secondary | ICD-10-CM | POA: Diagnosis not present

## 2020-05-05 NOTE — Progress Notes (Signed)
[facility] Psychotherapy Note  Name: Shela Esses Date: 05/04/20 MRN: [id number] DOB: 09/06/1985 PCP: Pearson Grippe, MD  Time spent: 56 minutes  Treatment:   Ind. Therapy  Mental Status Exam:   Appearance:   Casual     Behavior:  Appropriate  Motor:  Normal  Speech/Language:   Clear and Coherent  Affect:  constricted  Mood:  depressed, pleasant  Thought process:  normal  Thought content:    WNL  Sensory/Perceptual disturbances:    WNL  Orientation:  x4  Attention:  Good  Concentration:  Good  Memory:  WNL  Fund of knowledge:   Good  Insight:    Good  Judgment:   Good  Impulse Control:  Good   Reported Symptoms:  Depressed mood, anxiety, lower motivation, tearful / crying spells, some anhedonia, probs w/ focus/attention, distractible  Medical History/Surgical History: Past Medical History:  Diagnosis Date  . Arthritis    left knee  . Asthma    in past, none now  . Complication of anesthesia    left knee does not go straight due to arthritis please position carefully, uses pilloe to position at hs  . GERD (gastroesophageal reflux disease)    no recent meds  . Morbid obesity (HCC)     Past Surgical History:  Procedure Laterality Date  . LAPAROSCOPIC GASTRIC SLEEVE RESECTION N/A 04/18/2017   Procedure: LAPAROSCOPIC GASTRIC SLEEVE RESECTION, UPPER ENDO;  Surgeon: Berna Bue, MD;  Location: [facility];  Service: General;  Laterality: N/A;  . left knee meniscus and partial synovectomy  12/23/2016    Medications: Current Outpatient Medications  Medication Sig Dispense Refill  . albuterol (PROVENTIL HFA;VENTOLIN HFA) 108 (90 Base) MCG/ACT inhaler Inhale 1-2 puffs into the lungs every 6 (six) hours as needed for wheezing or shortness of breath.    . amphetamine-dextroamphetamine (ADDERALL) 10 MG tablet Take 10-20 mg by mouth 2 (two) times daily with a meal. Takes 20mg  in the AM and 10mg  in the afternoon  0  . calcium carbonate (OS-CAL - DOSED IN MG OF  ELEMENTAL CALCIUM) 1250 (500 Ca) MG tablet Take 500 tablets by mouth 3 (three) times daily with meals.    etanercept (ENBREL) 50 MG/ML injection Inject 50 mg into the skin every Tuesday.    . Multiple Vitamin (MULTIVITAMIN WITH MINERALS) TABS tablet Take 1 tablet by mouth daily.    . RaNITidine HCl (ZANTAC PO) Take 1 tablet by mouth at bedtime as needed (for acid reflex).    . sertraline (ZOLOFT) 50 MG tablet Take 1 tablet (50 mg total) by mouth daily. 30 tablet 5   No current facility-administered medications for this visit.    Allergies  Allergen Reactions  . Robitussin (Alcohol Free) [Guaifenesin] Other (See Comments)    Hallucinations    Subjective:  Patient presents patient presents for session on time.  She shared changes, how she and her husband separated about 2 weeks ago.  She shared how she communicated the need to separate to him and how he responded and has since that time.  She stated that he continues to appear angry and resentful.  She stated that he has taken some steps toward looking for a new job as this was something that she had discussed with him for over the past year.  She stated that she needs to time while separated to decrease her stress as she shared that going home as often stressful and emotional.  She identified thoughts and feelings related to this decision.  She shared how she has changed over time, continuing to want to express her opinions and feel that she has an equal partner in various aspects of the relationship.  She questions if this is possible at this point. She stated that she copes with anxiety daily, "I tend to be obsessive about some things"; she stated that she often has difficulty relaxing and likes her home to be organized and well kept.  Gave her homework assignment to identify specific areas of her life with which she feels her tendency to obsess manifest.  Intervention:  CBT, supportive therapy, solution focused therapy  Diagnoses:     ICD-10-CM   1. MDD (major depressive disorder), single episode, moderate (HCC)  F32.1   2. Obsessional thoughts  F42.8   3. Attention deficit hyperactivity disorder (ADHD), unspecified ADHD type  F90.9     Plan: Patient is to use CBT, mindfulness and coping skills to help decrease symptoms associated with their diagnosis. Patient to continue to work on her communicating needs and her marital relationship. Patient to follow-up with psychiatric assessment.   Long-term goal:   Reduce overall level, frequency, and intensity of the feelings of depression and anxiety 7/10 to a 0-2/10 in severity for at least 3 consecutive months.   Short-term goal:  Patient to utilize coping skills as discussed in session Decrease thoughts that increase feelings of anxiety and depression Patient to continue to express needs in her marital relationship Patient to continue to utilize her support system as needed. Patient to continue to engage in pleasurable activities such as spending time with her close friends Patient to engage in psychiatric visit and report any medication concerns as needed   Assessment of progress:  progressing    Waldron Session, [facility]

## 2020-05-07 DIAGNOSIS — Z79899 Other long term (current) drug therapy: Secondary | ICD-10-CM | POA: Diagnosis not present

## 2020-05-07 DIAGNOSIS — M2550 Pain in unspecified joint: Secondary | ICD-10-CM | POA: Diagnosis not present

## 2020-05-07 DIAGNOSIS — L4059 Other psoriatic arthropathy: Secondary | ICD-10-CM | POA: Diagnosis not present

## 2020-05-07 DIAGNOSIS — L409 Psoriasis, unspecified: Secondary | ICD-10-CM | POA: Diagnosis not present

## 2020-05-18 ENCOUNTER — Ambulatory Visit: Payer: Federal, State, Local not specified - PPO | Admitting: Mental Health

## 2020-05-25 ENCOUNTER — Ambulatory Visit: Payer: Federal, State, Local not specified - PPO | Admitting: Adult Health

## 2020-05-26 DIAGNOSIS — Z3202 Encounter for pregnancy test, result negative: Secondary | ICD-10-CM | POA: Diagnosis not present

## 2020-05-26 DIAGNOSIS — Z30430 Encounter for insertion of intrauterine contraceptive device: Secondary | ICD-10-CM | POA: Diagnosis not present

## 2020-06-04 ENCOUNTER — Ambulatory Visit: Payer: Federal, State, Local not specified - PPO | Admitting: Mental Health

## 2020-06-05 DIAGNOSIS — T8384XA Pain from genitourinary prosthetic devices, implants and grafts, initial encounter: Secondary | ICD-10-CM | POA: Diagnosis not present

## 2020-06-05 DIAGNOSIS — Z30432 Encounter for removal of intrauterine contraceptive device: Secondary | ICD-10-CM | POA: Diagnosis not present

## 2020-06-05 DIAGNOSIS — Z309 Encounter for contraceptive management, unspecified: Secondary | ICD-10-CM | POA: Diagnosis not present

## 2020-06-18 ENCOUNTER — Ambulatory Visit: Payer: Federal, State, Local not specified - PPO | Admitting: Mental Health

## 2020-07-15 DIAGNOSIS — Z20822 Contact with and (suspected) exposure to covid-19: Secondary | ICD-10-CM | POA: Diagnosis not present

## 2020-07-15 DIAGNOSIS — U071 COVID-19: Secondary | ICD-10-CM | POA: Diagnosis not present

## 2020-07-22 DIAGNOSIS — Z3009 Encounter for other general counseling and advice on contraception: Secondary | ICD-10-CM | POA: Diagnosis not present

## 2020-07-29 DIAGNOSIS — Z20822 Contact with and (suspected) exposure to covid-19: Secondary | ICD-10-CM | POA: Diagnosis not present

## 2020-09-07 DIAGNOSIS — R946 Abnormal results of thyroid function studies: Secondary | ICD-10-CM | POA: Diagnosis not present

## 2020-09-07 DIAGNOSIS — Z0000 Encounter for general adult medical examination without abnormal findings: Secondary | ICD-10-CM | POA: Diagnosis not present

## 2020-09-07 DIAGNOSIS — R7309 Other abnormal glucose: Secondary | ICD-10-CM | POA: Diagnosis not present

## 2020-09-14 DIAGNOSIS — L409 Psoriasis, unspecified: Secondary | ICD-10-CM | POA: Diagnosis not present

## 2020-09-14 DIAGNOSIS — Z0000 Encounter for general adult medical examination without abnormal findings: Secondary | ICD-10-CM | POA: Diagnosis not present

## 2020-09-14 DIAGNOSIS — F909 Attention-deficit hyperactivity disorder, unspecified type: Secondary | ICD-10-CM | POA: Diagnosis not present

## 2020-09-14 DIAGNOSIS — Z79899 Other long term (current) drug therapy: Secondary | ICD-10-CM | POA: Diagnosis not present

## 2020-09-22 DIAGNOSIS — Z6829 Body mass index (BMI) 29.0-29.9, adult: Secondary | ICD-10-CM | POA: Diagnosis not present

## 2020-09-22 DIAGNOSIS — Z01419 Encounter for gynecological examination (general) (routine) without abnormal findings: Secondary | ICD-10-CM | POA: Diagnosis not present

## 2020-09-22 DIAGNOSIS — Z319 Encounter for procreative management, unspecified: Secondary | ICD-10-CM | POA: Diagnosis not present

## 2020-10-06 DIAGNOSIS — L4059 Other psoriatic arthropathy: Secondary | ICD-10-CM | POA: Diagnosis not present

## 2020-10-06 DIAGNOSIS — M2550 Pain in unspecified joint: Secondary | ICD-10-CM | POA: Diagnosis not present

## 2020-10-06 DIAGNOSIS — Z79899 Other long term (current) drug therapy: Secondary | ICD-10-CM | POA: Diagnosis not present

## 2020-10-06 DIAGNOSIS — L409 Psoriasis, unspecified: Secondary | ICD-10-CM | POA: Diagnosis not present

## 2020-10-06 DIAGNOSIS — M150 Primary generalized (osteo)arthritis: Secondary | ICD-10-CM | POA: Diagnosis not present

## 2020-10-29 ENCOUNTER — Other Ambulatory Visit: Payer: Self-pay

## 2020-10-29 ENCOUNTER — Ambulatory Visit (INDEPENDENT_AMBULATORY_CARE_PROVIDER_SITE_OTHER): Payer: Federal, State, Local not specified - PPO | Admitting: Mental Health

## 2020-10-29 DIAGNOSIS — F321 Major depressive disorder, single episode, moderate: Secondary | ICD-10-CM | POA: Diagnosis not present

## 2020-10-29 NOTE — Progress Notes (Signed)
[facility] Psychotherapy Note  Name: Abigail Morales Date: 10/29/20 MRN: [id number] DOB: 05-25-1986 PCP: Pearson Grippe, MD  Time spent: 56 minutes  Treatment:   Ind. Therapy  Mental Status Exam:   Appearance:   Casual     Behavior:  Appropriate  Motor:  Normal  Speech/Language:   Clear and Coherent  Affect:  constricted  Mood:  depressed, pleasant  Thought process:  normal  Thought content:    WNL  Sensory/Perceptual disturbances:    WNL  Orientation:  x4  Attention:  Good  Concentration:  Good  Memory:  WNL  Fund of knowledge:   Good  Insight:    Good  Judgment:   Good  Impulse Control:  Good   Reported Symptoms:  Depressed mood, anxiety, lower motivation, tearful / crying spells, some anhedonia, probs w/ focus/attention, distractible  Medical History/Surgical History: Past Medical History:  Diagnosis Date  . Arthritis    left knee  . Asthma    in past, none now  . Complication of anesthesia    left knee does not go straight due to arthritis please position carefully, uses pilloe to position at hs  . GERD (gastroesophageal reflux disease)    no recent meds  . Morbid obesity (HCC)     Past Surgical History:  Procedure Laterality Date  . LAPAROSCOPIC GASTRIC SLEEVE RESECTION N/A 04/18/2017   Procedure: LAPAROSCOPIC GASTRIC SLEEVE RESECTION, UPPER ENDO;  Surgeon: Berna Bue, MD;  Location: [facility];  Service: General;  Laterality: N/A;  . left knee meniscus and partial synovectomy  12/23/2016    Medications: Current Outpatient Medications  Medication Sig Dispense Refill  . albuterol (PROVENTIL HFA;VENTOLIN HFA) 108 (90 Base) MCG/ACT inhaler Inhale 1-2 puffs into the lungs every 6 (six) hours as needed for wheezing or shortness of breath.    . amphetamine-dextroamphetamine (ADDERALL) 10 MG tablet Take 10-20 mg by mouth 2 (two) times daily with a meal. Takes 20mg  in the AM and 10mg  in the afternoon  0  . calcium carbonate (OS-CAL - DOSED IN MG OF  ELEMENTAL CALCIUM) 1250 (500 Ca) MG tablet Take 500 tablets by mouth 3 (three) times daily with meals.    etanercept (ENBREL) 50 MG/ML injection Inject 50 mg into the skin every Tuesday.    . Multiple Vitamin (MULTIVITAMIN WITH MINERALS) TABS tablet Take 1 tablet by mouth daily.    . RaNITidine HCl (ZANTAC PO) Take 1 tablet by mouth at bedtime as needed (for acid reflex).    . sertraline (ZOLOFT) 50 MG tablet Take 1 tablet (50 mg total) by mouth daily. 30 tablet 5   No current facility-administered medications for this visit.    Allergies  Allergen Reactions  . Robitussin (Alcohol Free) [Guaifenesin] Other (See Comments)    Hallucinations    Subjective:  Patient presents patient presents for session. Patient shared events and progress since last session which was about 6 months ago. She remains separated from her husband going on to share some details. She moved back into their house and he is now living outside their primary home. She's sharing how it's been more challenging due to having less distractions, being home alone when getting home from work gets her more time to think. At this point, she's unsure if she wants to continue the marriage that heals the pressure of having to make a decision soon due to financial reasons. Facilitated her identifying needs where she continues to have problems with reunification due to his not making changes with  what she has expressed to him over the last several months. She stated that she has started taking psychiatric medication prescribed over the past two weeks for her anxiety and she noticed it was increasing to the point where she was having a panic attack and felt depressed where she had difficulty getting out of bed often. She reports the medication has been helpful, and she plans to continue. Provided support well facilitating her and identifying needs.    Intervention:  CBT, supportive therapy, solution focused therapy  Diagnoses:    ICD-10-CM    1. MDD (major depressive disorder), single episode, moderate (HCC)  F32.1     Plan: Patient is to use CBT, mindfulness and coping skills to help decrease symptoms associated with their diagnosis. Patient to continue to work on her communicating needs and her marital relationship. Patient to use her support system and make efforts to stay active w/ them to avoid isolative behavior.   Long-term goal:   Reduce overall level, frequency, and intensity of the feelings of depression and anxiety 7/10 to a 0-2/10 in severity for at least 3 consecutive months.   Short-term goal:  Patient to utilize coping skills as discussed in session Decrease thoughts that increase feelings of anxiety and depression Patient to continue to express needs in her marital relationship Patient to continue to utilize her support system as needed. Patient to continue to engage in pleasurable activities such as spending time with her close friends Patient to engage in psychiatric visit and report any medication concerns as needed   Assessment of progress:  progressing    Waldron Session, [facility]

## 2020-11-05 ENCOUNTER — Encounter (HOSPITAL_COMMUNITY): Payer: Self-pay | Admitting: *Deleted

## 2020-11-11 DIAGNOSIS — J029 Acute pharyngitis, unspecified: Secondary | ICD-10-CM | POA: Diagnosis not present

## 2020-11-11 DIAGNOSIS — J019 Acute sinusitis, unspecified: Secondary | ICD-10-CM | POA: Diagnosis not present

## 2020-11-11 DIAGNOSIS — R059 Cough, unspecified: Secondary | ICD-10-CM | POA: Diagnosis not present

## 2020-11-11 DIAGNOSIS — Z20822 Contact with and (suspected) exposure to covid-19: Secondary | ICD-10-CM | POA: Diagnosis not present

## 2020-11-11 DIAGNOSIS — J209 Acute bronchitis, unspecified: Secondary | ICD-10-CM | POA: Diagnosis not present

## 2020-11-26 ENCOUNTER — Ambulatory Visit: Payer: Federal, State, Local not specified - PPO | Admitting: Mental Health

## 2021-01-11 DIAGNOSIS — M2550 Pain in unspecified joint: Secondary | ICD-10-CM | POA: Diagnosis not present

## 2021-01-11 DIAGNOSIS — R5383 Other fatigue: Secondary | ICD-10-CM | POA: Diagnosis not present

## 2021-01-11 DIAGNOSIS — Z79899 Other long term (current) drug therapy: Secondary | ICD-10-CM | POA: Diagnosis not present

## 2021-01-11 DIAGNOSIS — L4059 Other psoriatic arthropathy: Secondary | ICD-10-CM | POA: Diagnosis not present

## 2021-01-11 DIAGNOSIS — M150 Primary generalized (osteo)arthritis: Secondary | ICD-10-CM | POA: Diagnosis not present

## 2021-01-11 DIAGNOSIS — L409 Psoriasis, unspecified: Secondary | ICD-10-CM | POA: Diagnosis not present

## 2021-02-10 ENCOUNTER — Ambulatory Visit: Payer: Federal, State, Local not specified - PPO | Admitting: Mental Health

## 2021-03-15 DIAGNOSIS — L4059 Other psoriatic arthropathy: Secondary | ICD-10-CM | POA: Diagnosis not present

## 2021-03-15 DIAGNOSIS — M2550 Pain in unspecified joint: Secondary | ICD-10-CM | POA: Diagnosis not present

## 2021-03-18 DIAGNOSIS — F909 Attention-deficit hyperactivity disorder, unspecified type: Secondary | ICD-10-CM | POA: Diagnosis not present

## 2021-03-18 DIAGNOSIS — R1030 Lower abdominal pain, unspecified: Secondary | ICD-10-CM | POA: Diagnosis not present

## 2021-03-18 DIAGNOSIS — N760 Acute vaginitis: Secondary | ICD-10-CM | POA: Diagnosis not present

## 2021-03-18 DIAGNOSIS — F419 Anxiety disorder, unspecified: Secondary | ICD-10-CM | POA: Diagnosis not present

## 2021-03-18 DIAGNOSIS — Z113 Encounter for screening for infections with a predominantly sexual mode of transmission: Secondary | ICD-10-CM | POA: Diagnosis not present

## 2021-03-18 DIAGNOSIS — Z79899 Other long term (current) drug therapy: Secondary | ICD-10-CM | POA: Diagnosis not present

## 2021-03-25 DIAGNOSIS — R102 Pelvic and perineal pain: Secondary | ICD-10-CM | POA: Diagnosis not present

## 2021-04-12 DIAGNOSIS — N938 Other specified abnormal uterine and vaginal bleeding: Secondary | ICD-10-CM | POA: Diagnosis not present

## 2021-04-26 DIAGNOSIS — N938 Other specified abnormal uterine and vaginal bleeding: Secondary | ICD-10-CM | POA: Diagnosis not present

## 2021-04-29 DIAGNOSIS — N938 Other specified abnormal uterine and vaginal bleeding: Secondary | ICD-10-CM | POA: Diagnosis not present

## 2021-04-29 DIAGNOSIS — N8501 Benign endometrial hyperplasia: Secondary | ICD-10-CM | POA: Diagnosis not present

## 2021-04-29 DIAGNOSIS — N840 Polyp of corpus uteri: Secondary | ICD-10-CM | POA: Diagnosis not present

## 2021-04-29 DIAGNOSIS — N939 Abnormal uterine and vaginal bleeding, unspecified: Secondary | ICD-10-CM | POA: Diagnosis not present

## 2021-04-29 DIAGNOSIS — Z3202 Encounter for pregnancy test, result negative: Secondary | ICD-10-CM | POA: Diagnosis not present

## 2021-05-05 DIAGNOSIS — L4059 Other psoriatic arthropathy: Secondary | ICD-10-CM | POA: Diagnosis not present

## 2021-05-05 DIAGNOSIS — L409 Psoriasis, unspecified: Secondary | ICD-10-CM | POA: Diagnosis not present

## 2021-05-05 DIAGNOSIS — Z79899 Other long term (current) drug therapy: Secondary | ICD-10-CM | POA: Diagnosis not present

## 2021-05-05 DIAGNOSIS — M2550 Pain in unspecified joint: Secondary | ICD-10-CM | POA: Diagnosis not present

## 2021-05-25 DIAGNOSIS — N926 Irregular menstruation, unspecified: Secondary | ICD-10-CM | POA: Diagnosis not present

## 2021-07-01 ENCOUNTER — Ambulatory Visit: Payer: Federal, State, Local not specified - PPO | Admitting: Mental Health

## 2021-07-13 ENCOUNTER — Other Ambulatory Visit: Payer: Self-pay

## 2021-07-13 ENCOUNTER — Ambulatory Visit (INDEPENDENT_AMBULATORY_CARE_PROVIDER_SITE_OTHER): Payer: Federal, State, Local not specified - PPO | Admitting: Mental Health

## 2021-07-13 DIAGNOSIS — F321 Major depressive disorder, single episode, moderate: Secondary | ICD-10-CM | POA: Diagnosis not present
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
## 2021-07-29 ENCOUNTER — Ambulatory Visit: Payer: Federal, State, Local not specified - PPO | Admitting: Mental Health

## 2021-08-11 ENCOUNTER — Other Ambulatory Visit: Payer: Self-pay

## 2021-08-11 ENCOUNTER — Ambulatory Visit (INDEPENDENT_AMBULATORY_CARE_PROVIDER_SITE_OTHER): Payer: Federal, State, Local not specified - PPO | Admitting: Mental Health

## 2021-08-11 DIAGNOSIS — F321 Major depressive disorder, single episode, moderate: Secondary | ICD-10-CM
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
## 2021-08-18 DIAGNOSIS — M2550 Pain in unspecified joint: Secondary | ICD-10-CM | POA: Diagnosis not present

## 2021-08-18 DIAGNOSIS — Z79899 Other long term (current) drug therapy: Secondary | ICD-10-CM | POA: Diagnosis not present

## 2021-08-18 DIAGNOSIS — L4059 Other psoriatic arthropathy: Secondary | ICD-10-CM | POA: Diagnosis not present

## 2021-08-18 DIAGNOSIS — L409 Psoriasis, unspecified: Secondary | ICD-10-CM | POA: Diagnosis not present

## 2021-08-25 ENCOUNTER — Ambulatory Visit (INDEPENDENT_AMBULATORY_CARE_PROVIDER_SITE_OTHER): Payer: Federal, State, Local not specified - PPO | Admitting: Mental Health

## 2021-08-25 DIAGNOSIS — F321 Major depressive disorder, single episode, moderate: Secondary | ICD-10-CM
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
## 2021-09-08 ENCOUNTER — Ambulatory Visit: Payer: Federal, State, Local not specified - PPO | Admitting: Mental Health

## 2021-09-16 DIAGNOSIS — Z0000 Encounter for general adult medical examination without abnormal findings: Secondary | ICD-10-CM | POA: Diagnosis not present

## 2021-09-16 DIAGNOSIS — L409 Psoriasis, unspecified: Secondary | ICD-10-CM | POA: Diagnosis not present

## 2021-09-16 DIAGNOSIS — L4050 Arthropathic psoriasis, unspecified: Secondary | ICD-10-CM | POA: Diagnosis not present

## 2021-09-16 DIAGNOSIS — F988 Other specified behavioral and emotional disorders with onset usually occurring in childhood and adolescence: Secondary | ICD-10-CM | POA: Diagnosis not present

## 2021-09-21 ENCOUNTER — Ambulatory Visit: Payer: Federal, State, Local not specified - PPO | Admitting: Mental Health

## 2021-09-23 DIAGNOSIS — Z01411 Encounter for gynecological examination (general) (routine) with abnormal findings: Secondary | ICD-10-CM | POA: Diagnosis not present

## 2021-09-23 DIAGNOSIS — Z6830 Body mass index (BMI) 30.0-30.9, adult: Secondary | ICD-10-CM | POA: Diagnosis not present

## 2021-09-23 DIAGNOSIS — N903 Dysplasia of vulva, unspecified: Secondary | ICD-10-CM | POA: Diagnosis not present

## 2021-10-01 NOTE — Progress Notes (Signed)
Canceled appt.

## 2021-11-11 ENCOUNTER — Telehealth: Payer: Self-pay | Admitting: Adult Health

## 2021-11-11 NOTE — Telephone Encounter (Signed)
Pt LVM at 11:57a.  She would like refill of Sertraline sent to [facility]. ? ?No upcoming appts scheduled. ?

## 2021-11-11 NOTE — Telephone Encounter (Signed)
We can give enough until the appt.

## 2021-11-11 NOTE — Telephone Encounter (Signed)
Please call patient to schedule an appt with Almira Coaster. Last seen in 2021 and she is asking for a refill.  ?

## 2021-11-12 ENCOUNTER — Other Ambulatory Visit: Payer: Self-pay

## 2021-11-12 MED ORDER — SERTRALINE HCL 50 MG PO TABS: 50.0000 mg | ORAL_TABLET | Freq: Every day | ORAL | 0 refills | Status: AC

## 2021-11-24 ENCOUNTER — Ambulatory Visit: Payer: Federal, State, Local not specified - PPO | Admitting: Adult Health

## 2021-12-01 DIAGNOSIS — L4059 Other psoriatic arthropathy: Secondary | ICD-10-CM | POA: Diagnosis not present

## 2022-01-11 DIAGNOSIS — D508 Other iron deficiency anemias: Secondary | ICD-10-CM | POA: Diagnosis not present

## 2022-01-11 DIAGNOSIS — N938 Other specified abnormal uterine and vaginal bleeding: Secondary | ICD-10-CM | POA: Diagnosis not present

## 2022-01-11 DIAGNOSIS — D649 Anemia, unspecified: Secondary | ICD-10-CM | POA: Diagnosis not present

## 2022-01-11 DIAGNOSIS — Z3202 Encounter for pregnancy test, result negative: Secondary | ICD-10-CM | POA: Diagnosis not present

## 2022-01-18 DIAGNOSIS — D508 Other iron deficiency anemias: Secondary | ICD-10-CM | POA: Diagnosis not present

## 2022-01-18 DIAGNOSIS — N938 Other specified abnormal uterine and vaginal bleeding: Secondary | ICD-10-CM | POA: Diagnosis not present

## 2022-02-14 DIAGNOSIS — M25562 Pain in left knee: Secondary | ICD-10-CM | POA: Diagnosis not present

## 2022-02-14 DIAGNOSIS — M222X2 Patellofemoral disorders, left knee: Secondary | ICD-10-CM | POA: Diagnosis not present

## 2022-03-04 DIAGNOSIS — Z0000 Encounter for general adult medical examination without abnormal findings: Secondary | ICD-10-CM | POA: Diagnosis not present

## 2022-03-04 DIAGNOSIS — E119 Type 2 diabetes mellitus without complications: Secondary | ICD-10-CM | POA: Diagnosis not present

## 2022-03-04 DIAGNOSIS — D649 Anemia, unspecified: Secondary | ICD-10-CM | POA: Diagnosis not present

## 2022-03-16 DIAGNOSIS — Z79899 Other long term (current) drug therapy: Secondary | ICD-10-CM | POA: Diagnosis not present

## 2022-03-16 DIAGNOSIS — F988 Other specified behavioral and emotional disorders with onset usually occurring in childhood and adolescence: Secondary | ICD-10-CM | POA: Diagnosis not present

## 2022-03-21 DIAGNOSIS — E785 Hyperlipidemia, unspecified: Secondary | ICD-10-CM | POA: Diagnosis not present

## 2022-03-21 DIAGNOSIS — F988 Other specified behavioral and emotional disorders with onset usually occurring in childhood and adolescence: Secondary | ICD-10-CM | POA: Diagnosis not present

## 2022-03-21 DIAGNOSIS — Z789 Other specified health status: Secondary | ICD-10-CM | POA: Diagnosis not present

## 2022-04-06 DIAGNOSIS — L4059 Other psoriatic arthropathy: Secondary | ICD-10-CM | POA: Diagnosis not present

## 2022-04-06 DIAGNOSIS — R5383 Other fatigue: Secondary | ICD-10-CM | POA: Diagnosis not present

## 2022-05-25 DIAGNOSIS — N920 Excessive and frequent menstruation with regular cycle: Secondary | ICD-10-CM | POA: Diagnosis not present

## 2022-07-06 DIAGNOSIS — M2550 Pain in unspecified joint: Secondary | ICD-10-CM | POA: Diagnosis not present

## 2022-07-06 DIAGNOSIS — J029 Acute pharyngitis, unspecified: Secondary | ICD-10-CM | POA: Diagnosis not present

## 2022-07-06 DIAGNOSIS — L4059 Other psoriatic arthropathy: Secondary | ICD-10-CM | POA: Diagnosis not present

## 2022-07-06 DIAGNOSIS — Z03818 Encounter for observation for suspected exposure to other biological agents ruled out: Secondary | ICD-10-CM | POA: Diagnosis not present

## 2022-07-06 DIAGNOSIS — Z6831 Body mass index (BMI) 31.0-31.9, adult: Secondary | ICD-10-CM | POA: Diagnosis not present

## 2022-07-06 DIAGNOSIS — Z789 Other specified health status: Secondary | ICD-10-CM | POA: Diagnosis not present

## 2022-07-06 DIAGNOSIS — L409 Psoriasis, unspecified: Secondary | ICD-10-CM | POA: Diagnosis not present

## 2022-07-06 DIAGNOSIS — Z79899 Other long term (current) drug therapy: Secondary | ICD-10-CM | POA: Diagnosis not present

## 2022-07-07 DIAGNOSIS — Z20828 Contact with and (suspected) exposure to other viral communicable diseases: Secondary | ICD-10-CM | POA: Diagnosis not present

## 2022-07-07 DIAGNOSIS — J101 Influenza due to other identified influenza virus with other respiratory manifestations: Secondary | ICD-10-CM | POA: Diagnosis not present

## 2022-07-07 DIAGNOSIS — Z6831 Body mass index (BMI) 31.0-31.9, adult: Secondary | ICD-10-CM | POA: Diagnosis not present

## 2022-07-07 DIAGNOSIS — J029 Acute pharyngitis, unspecified: Secondary | ICD-10-CM | POA: Diagnosis not present

## 2022-07-11 DIAGNOSIS — L4059 Other psoriatic arthropathy: Secondary | ICD-10-CM | POA: Diagnosis not present

## 2022-08-08 ENCOUNTER — Other Ambulatory Visit (HOSPITAL_COMMUNITY): Payer: Self-pay

## 2022-08-08 DIAGNOSIS — R739 Hyperglycemia, unspecified: Secondary | ICD-10-CM | POA: Diagnosis not present

## 2022-08-08 DIAGNOSIS — R5383 Other fatigue: Secondary | ICD-10-CM | POA: Diagnosis not present

## 2022-08-08 DIAGNOSIS — F419 Anxiety disorder, unspecified: Secondary | ICD-10-CM | POA: Diagnosis not present

## 2022-08-08 DIAGNOSIS — Z0000 Encounter for general adult medical examination without abnormal findings: Secondary | ICD-10-CM | POA: Diagnosis not present

## 2022-08-08 DIAGNOSIS — E785 Hyperlipidemia, unspecified: Secondary | ICD-10-CM | POA: Diagnosis not present

## 2022-08-09 ENCOUNTER — Other Ambulatory Visit (HOSPITAL_COMMUNITY): Payer: Self-pay

## 2022-08-12 DIAGNOSIS — L409 Psoriasis, unspecified: Secondary | ICD-10-CM | POA: Diagnosis not present

## 2022-08-12 DIAGNOSIS — Z0000 Encounter for general adult medical examination without abnormal findings: Secondary | ICD-10-CM | POA: Diagnosis not present

## 2022-08-12 DIAGNOSIS — F988 Other specified behavioral and emotional disorders with onset usually occurring in childhood and adolescence: Secondary | ICD-10-CM | POA: Diagnosis not present

## 2022-08-12 DIAGNOSIS — L4050 Arthropathic psoriasis, unspecified: Secondary | ICD-10-CM | POA: Diagnosis not present

## 2022-09-20 DIAGNOSIS — L409 Psoriasis, unspecified: Secondary | ICD-10-CM | POA: Diagnosis not present

## 2022-09-20 DIAGNOSIS — R5383 Other fatigue: Secondary | ICD-10-CM | POA: Diagnosis not present

## 2022-09-20 DIAGNOSIS — L4059 Other psoriatic arthropathy: Secondary | ICD-10-CM | POA: Diagnosis not present

## 2022-09-20 DIAGNOSIS — Z79899 Other long term (current) drug therapy: Secondary | ICD-10-CM | POA: Diagnosis not present

## 2022-09-27 DIAGNOSIS — Z124 Encounter for screening for malignant neoplasm of cervix: Secondary | ICD-10-CM | POA: Diagnosis not present

## 2022-09-27 DIAGNOSIS — Z6833 Body mass index (BMI) 33.0-33.9, adult: Secondary | ICD-10-CM | POA: Diagnosis not present

## 2022-09-27 DIAGNOSIS — Z01419 Encounter for gynecological examination (general) (routine) without abnormal findings: Secondary | ICD-10-CM | POA: Diagnosis not present

## 2022-09-27 DIAGNOSIS — Z1151 Encounter for screening for human papillomavirus (HPV): Secondary | ICD-10-CM | POA: Diagnosis not present

## 2023-02-01 ENCOUNTER — Other Ambulatory Visit (HOSPITAL_COMMUNITY): Payer: Self-pay | Admitting: Surgery

## 2023-02-01 DIAGNOSIS — Z8719 Personal history of other diseases of the digestive system: Secondary | ICD-10-CM

## 2023-02-01 DIAGNOSIS — K219 Gastro-esophageal reflux disease without esophagitis: Secondary | ICD-10-CM | POA: Diagnosis not present

## 2023-02-01 DIAGNOSIS — Z9884 Bariatric surgery status: Secondary | ICD-10-CM | POA: Diagnosis not present

## 2023-02-07 DIAGNOSIS — R635 Abnormal weight gain: Secondary | ICD-10-CM | POA: Diagnosis not present

## 2023-02-07 DIAGNOSIS — K219 Gastro-esophageal reflux disease without esophagitis: Secondary | ICD-10-CM | POA: Diagnosis not present

## 2023-02-07 DIAGNOSIS — Z79899 Other long term (current) drug therapy: Secondary | ICD-10-CM | POA: Diagnosis not present

## 2023-02-07 DIAGNOSIS — Z9884 Bariatric surgery status: Secondary | ICD-10-CM | POA: Diagnosis not present

## 2023-02-14 ENCOUNTER — Encounter: Payer: Federal, State, Local not specified - PPO | Admitting: Bariatrics

## 2023-02-20 DIAGNOSIS — L4050 Arthropathic psoriasis, unspecified: Secondary | ICD-10-CM | POA: Diagnosis not present

## 2023-02-20 DIAGNOSIS — F988 Other specified behavioral and emotional disorders with onset usually occurring in childhood and adolescence: Secondary | ICD-10-CM | POA: Diagnosis not present

## 2023-02-20 DIAGNOSIS — E785 Hyperlipidemia, unspecified: Secondary | ICD-10-CM | POA: Diagnosis not present

## 2023-02-20 DIAGNOSIS — Z79899 Other long term (current) drug therapy: Secondary | ICD-10-CM | POA: Diagnosis not present

## 2023-02-24 ENCOUNTER — Encounter (HOSPITAL_COMMUNITY): Payer: Self-pay

## 2023-02-24 ENCOUNTER — Ambulatory Visit (HOSPITAL_COMMUNITY): Payer: Federal, State, Local not specified - PPO

## 2023-03-15 DIAGNOSIS — Z6833 Body mass index (BMI) 33.0-33.9, adult: Secondary | ICD-10-CM | POA: Diagnosis not present

## 2023-03-15 DIAGNOSIS — K3189 Other diseases of stomach and duodenum: Secondary | ICD-10-CM | POA: Diagnosis not present

## 2023-03-15 DIAGNOSIS — Z79899 Other long term (current) drug therapy: Secondary | ICD-10-CM | POA: Diagnosis not present

## 2023-03-15 DIAGNOSIS — K219 Gastro-esophageal reflux disease without esophagitis: Secondary | ICD-10-CM | POA: Diagnosis not present

## 2023-03-15 DIAGNOSIS — Z888 Allergy status to other drugs, medicaments and biological substances status: Secondary | ICD-10-CM | POA: Diagnosis not present

## 2023-03-15 DIAGNOSIS — R635 Abnormal weight gain: Secondary | ICD-10-CM | POA: Diagnosis not present

## 2023-03-15 DIAGNOSIS — F32A Depression, unspecified: Secondary | ICD-10-CM | POA: Diagnosis not present

## 2023-03-15 DIAGNOSIS — J4520 Mild intermittent asthma, uncomplicated: Secondary | ICD-10-CM | POA: Diagnosis not present

## 2023-03-15 DIAGNOSIS — Z7985 Long-term (current) use of injectable non-insulin antidiabetic drugs: Secondary | ICD-10-CM | POA: Diagnosis not present

## 2023-03-15 DIAGNOSIS — R1310 Dysphagia, unspecified: Secondary | ICD-10-CM | POA: Diagnosis not present

## 2023-03-15 DIAGNOSIS — F419 Anxiety disorder, unspecified: Secondary | ICD-10-CM | POA: Diagnosis not present

## 2023-03-15 DIAGNOSIS — Z9884 Bariatric surgery status: Secondary | ICD-10-CM | POA: Diagnosis not present

## 2023-03-27 DIAGNOSIS — Z9884 Bariatric surgery status: Secondary | ICD-10-CM | POA: Diagnosis not present

## 2023-03-27 DIAGNOSIS — K219 Gastro-esophageal reflux disease without esophagitis: Secondary | ICD-10-CM | POA: Diagnosis not present

## 2023-04-12 DIAGNOSIS — L4059 Other psoriatic arthropathy: Secondary | ICD-10-CM | POA: Diagnosis not present

## 2023-04-12 DIAGNOSIS — R5383 Other fatigue: Secondary | ICD-10-CM | POA: Diagnosis not present

## 2023-04-12 DIAGNOSIS — Z79899 Other long term (current) drug therapy: Secondary | ICD-10-CM | POA: Diagnosis not present

## 2023-04-12 DIAGNOSIS — L409 Psoriasis, unspecified: Secondary | ICD-10-CM | POA: Diagnosis not present

## 2023-05-17 DIAGNOSIS — K080 Exfoliation of teeth due to systemic causes: Secondary | ICD-10-CM | POA: Diagnosis not present

## 2023-06-21 DIAGNOSIS — R7309 Other abnormal glucose: Secondary | ICD-10-CM | POA: Diagnosis not present

## 2023-08-07 DIAGNOSIS — R5383 Other fatigue: Secondary | ICD-10-CM | POA: Diagnosis not present

## 2023-08-07 DIAGNOSIS — L4059 Other psoriatic arthropathy: Secondary | ICD-10-CM | POA: Diagnosis not present

## 2023-08-07 DIAGNOSIS — L409 Psoriasis, unspecified: Secondary | ICD-10-CM | POA: Diagnosis not present

## 2023-08-07 DIAGNOSIS — Z79899 Other long term (current) drug therapy: Secondary | ICD-10-CM | POA: Diagnosis not present

## 2023-08-18 DIAGNOSIS — E785 Hyperlipidemia, unspecified: Secondary | ICD-10-CM | POA: Diagnosis not present

## 2023-08-18 DIAGNOSIS — F988 Other specified behavioral and emotional disorders with onset usually occurring in childhood and adolescence: Secondary | ICD-10-CM | POA: Diagnosis not present

## 2023-08-18 DIAGNOSIS — E669 Obesity, unspecified: Secondary | ICD-10-CM | POA: Diagnosis not present

## 2023-08-18 DIAGNOSIS — L4050 Arthropathic psoriasis, unspecified: Secondary | ICD-10-CM | POA: Diagnosis not present

## 2023-08-30 DIAGNOSIS — F419 Anxiety disorder, unspecified: Secondary | ICD-10-CM | POA: Diagnosis not present

## 2023-08-30 DIAGNOSIS — Z0000 Encounter for general adult medical examination without abnormal findings: Secondary | ICD-10-CM | POA: Diagnosis not present

## 2023-08-30 DIAGNOSIS — L4050 Arthropathic psoriasis, unspecified: Secondary | ICD-10-CM | POA: Diagnosis not present

## 2023-08-30 DIAGNOSIS — Z79899 Other long term (current) drug therapy: Secondary | ICD-10-CM | POA: Diagnosis not present

## 2023-08-30 DIAGNOSIS — F988 Other specified behavioral and emotional disorders with onset usually occurring in childhood and adolescence: Secondary | ICD-10-CM | POA: Diagnosis not present

## 2023-08-30 DIAGNOSIS — R233 Spontaneous ecchymoses: Secondary | ICD-10-CM | POA: Diagnosis not present

## 2023-08-30 DIAGNOSIS — R42 Dizziness and giddiness: Secondary | ICD-10-CM | POA: Diagnosis not present

## 2023-08-30 DIAGNOSIS — R5381 Other malaise: Secondary | ICD-10-CM | POA: Diagnosis not present

## 2023-08-30 DIAGNOSIS — L409 Psoriasis, unspecified: Secondary | ICD-10-CM | POA: Diagnosis not present

## 2023-09-27 DIAGNOSIS — L309 Dermatitis, unspecified: Secondary | ICD-10-CM | POA: Diagnosis not present

## 2023-09-27 DIAGNOSIS — Z6830 Body mass index (BMI) 30.0-30.9, adult: Secondary | ICD-10-CM | POA: Diagnosis not present

## 2023-09-27 DIAGNOSIS — Z01411 Encounter for gynecological examination (general) (routine) with abnormal findings: Secondary | ICD-10-CM | POA: Diagnosis not present

## 2023-09-27 DIAGNOSIS — Z1331 Encounter for screening for depression: Secondary | ICD-10-CM | POA: Diagnosis not present

## 2023-10-04 DIAGNOSIS — N906 Unspecified hypertrophy of vulva: Secondary | ICD-10-CM | POA: Diagnosis not present

## 2023-10-17 DIAGNOSIS — Z9884 Bariatric surgery status: Secondary | ICD-10-CM | POA: Diagnosis not present

## 2023-10-24 DIAGNOSIS — M79672 Pain in left foot: Secondary | ICD-10-CM | POA: Diagnosis not present

## 2023-10-24 DIAGNOSIS — L4050 Arthropathic psoriasis, unspecified: Secondary | ICD-10-CM | POA: Diagnosis not present

## 2023-10-24 DIAGNOSIS — M2550 Pain in unspecified joint: Secondary | ICD-10-CM | POA: Diagnosis not present

## 2023-10-24 DIAGNOSIS — M79642 Pain in left hand: Secondary | ICD-10-CM | POA: Diagnosis not present

## 2023-10-24 DIAGNOSIS — M79671 Pain in right foot: Secondary | ICD-10-CM | POA: Diagnosis not present

## 2023-10-24 DIAGNOSIS — M79643 Pain in unspecified hand: Secondary | ICD-10-CM | POA: Diagnosis not present

## 2023-10-24 DIAGNOSIS — Z79899 Other long term (current) drug therapy: Secondary | ICD-10-CM | POA: Diagnosis not present

## 2023-10-24 DIAGNOSIS — M79641 Pain in right hand: Secondary | ICD-10-CM | POA: Diagnosis not present

## 2023-10-24 DIAGNOSIS — M549 Dorsalgia, unspecified: Secondary | ICD-10-CM | POA: Diagnosis not present

## 2023-10-25 DIAGNOSIS — N9419 Other specified dyspareunia: Secondary | ICD-10-CM | POA: Diagnosis not present

## 2023-10-25 DIAGNOSIS — N906 Unspecified hypertrophy of vulva: Secondary | ICD-10-CM | POA: Diagnosis not present

## 2023-10-25 DIAGNOSIS — N904 Leukoplakia of vulva: Secondary | ICD-10-CM | POA: Diagnosis not present

## 2023-12-05 DIAGNOSIS — G5603 Carpal tunnel syndrome, bilateral upper limbs: Secondary | ICD-10-CM | POA: Diagnosis not present

## 2023-12-07 DIAGNOSIS — K011 Impacted teeth: Secondary | ICD-10-CM | POA: Diagnosis not present

## 2023-12-19 DIAGNOSIS — Z1329 Encounter for screening for other suspected endocrine disorder: Secondary | ICD-10-CM | POA: Diagnosis not present

## 2023-12-19 DIAGNOSIS — K5900 Constipation, unspecified: Secondary | ICD-10-CM | POA: Diagnosis not present

## 2023-12-19 DIAGNOSIS — R5382 Chronic fatigue, unspecified: Secondary | ICD-10-CM | POA: Diagnosis not present

## 2023-12-19 DIAGNOSIS — E611 Iron deficiency: Secondary | ICD-10-CM | POA: Diagnosis not present

## 2023-12-19 DIAGNOSIS — N926 Irregular menstruation, unspecified: Secondary | ICD-10-CM | POA: Diagnosis not present

## 2023-12-19 DIAGNOSIS — E617 Deficiency of multiple nutrient elements: Secondary | ICD-10-CM | POA: Diagnosis not present

## 2023-12-29 DIAGNOSIS — K5900 Constipation, unspecified: Secondary | ICD-10-CM | POA: Diagnosis not present

## 2023-12-29 DIAGNOSIS — R5382 Chronic fatigue, unspecified: Secondary | ICD-10-CM | POA: Diagnosis not present

## 2023-12-29 DIAGNOSIS — Z1329 Encounter for screening for other suspected endocrine disorder: Secondary | ICD-10-CM | POA: Diagnosis not present

## 2023-12-29 DIAGNOSIS — E617 Deficiency of multiple nutrient elements: Secondary | ICD-10-CM | POA: Diagnosis not present

## 2024-02-07 DIAGNOSIS — D509 Iron deficiency anemia, unspecified: Secondary | ICD-10-CM | POA: Diagnosis not present

## 2024-02-07 DIAGNOSIS — K5900 Constipation, unspecified: Secondary | ICD-10-CM | POA: Diagnosis not present

## 2024-02-21 DIAGNOSIS — Z79899 Other long term (current) drug therapy: Secondary | ICD-10-CM | POA: Diagnosis not present

## 2024-02-21 DIAGNOSIS — L4050 Arthropathic psoriasis, unspecified: Secondary | ICD-10-CM | POA: Diagnosis not present

## 2024-02-21 DIAGNOSIS — F988 Other specified behavioral and emotional disorders with onset usually occurring in childhood and adolescence: Secondary | ICD-10-CM | POA: Diagnosis not present

## 2024-02-28 DIAGNOSIS — Z8639 Personal history of other endocrine, nutritional and metabolic disease: Secondary | ICD-10-CM | POA: Diagnosis not present

## 2024-02-28 DIAGNOSIS — F988 Other specified behavioral and emotional disorders with onset usually occurring in childhood and adolescence: Secondary | ICD-10-CM | POA: Diagnosis not present

## 2024-02-28 DIAGNOSIS — R5383 Other fatigue: Secondary | ICD-10-CM | POA: Diagnosis not present

## 2024-02-28 DIAGNOSIS — Z79899 Other long term (current) drug therapy: Secondary | ICD-10-CM | POA: Diagnosis not present

## 2024-03-11 DIAGNOSIS — N9489 Other specified conditions associated with female genital organs and menstrual cycle: Secondary | ICD-10-CM | POA: Diagnosis not present

## 2024-03-11 DIAGNOSIS — L738 Other specified follicular disorders: Secondary | ICD-10-CM | POA: Diagnosis not present

## 2024-03-11 DIAGNOSIS — N904 Leukoplakia of vulva: Secondary | ICD-10-CM | POA: Diagnosis not present

## 2024-03-11 DIAGNOSIS — N906 Unspecified hypertrophy of vulva: Secondary | ICD-10-CM | POA: Diagnosis not present

## 2024-03-11 DIAGNOSIS — L308 Other specified dermatitis: Secondary | ICD-10-CM | POA: Diagnosis not present

## 2024-03-11 DIAGNOSIS — N9089 Other specified noninflammatory disorders of vulva and perineum: Secondary | ICD-10-CM | POA: Diagnosis not present

## 2024-03-11 DIAGNOSIS — L309 Dermatitis, unspecified: Secondary | ICD-10-CM | POA: Diagnosis not present

## 2024-03-15 DIAGNOSIS — N926 Irregular menstruation, unspecified: Secondary | ICD-10-CM | POA: Diagnosis not present

## 2024-03-15 DIAGNOSIS — E611 Iron deficiency: Secondary | ICD-10-CM | POA: Diagnosis not present

## 2024-03-15 DIAGNOSIS — E617 Deficiency of multiple nutrient elements: Secondary | ICD-10-CM | POA: Diagnosis not present

## 2024-03-15 DIAGNOSIS — K5900 Constipation, unspecified: Secondary | ICD-10-CM | POA: Diagnosis not present

## 2024-04-04 DIAGNOSIS — M79643 Pain in unspecified hand: Secondary | ICD-10-CM | POA: Diagnosis not present

## 2024-04-04 DIAGNOSIS — Z79899 Other long term (current) drug therapy: Secondary | ICD-10-CM | POA: Diagnosis not present

## 2024-04-04 DIAGNOSIS — L4050 Arthropathic psoriasis, unspecified: Secondary | ICD-10-CM | POA: Diagnosis not present

## 2024-04-04 DIAGNOSIS — M2550 Pain in unspecified joint: Secondary | ICD-10-CM | POA: Diagnosis not present

## 2024-04-10 DIAGNOSIS — L308 Other specified dermatitis: Secondary | ICD-10-CM | POA: Diagnosis not present

## 2024-04-10 DIAGNOSIS — R82998 Other abnormal findings in urine: Secondary | ICD-10-CM | POA: Diagnosis not present

## 2024-04-10 DIAGNOSIS — R8290 Unspecified abnormal findings in urine: Secondary | ICD-10-CM | POA: Diagnosis not present

## 2024-04-17 ENCOUNTER — Inpatient Hospital Stay: Admitting: Hematology and Oncology

## 2024-04-17 ENCOUNTER — Inpatient Hospital Stay: Attending: Hematology and Oncology

## 2024-04-17 VITALS — BP 118/70 | HR 71 | Temp 98.1°F | Resp 14 | Wt 174.5 lb

## 2024-04-17 DIAGNOSIS — R5383 Other fatigue: Secondary | ICD-10-CM

## 2024-04-17 DIAGNOSIS — D509 Iron deficiency anemia, unspecified: Secondary | ICD-10-CM

## 2024-04-17 DIAGNOSIS — D500 Iron deficiency anemia secondary to blood loss (chronic): Secondary | ICD-10-CM

## 2024-04-17 DIAGNOSIS — Z833 Family history of diabetes mellitus: Secondary | ICD-10-CM | POA: Insufficient documentation

## 2024-04-17 DIAGNOSIS — N920 Excessive and frequent menstruation with regular cycle: Secondary | ICD-10-CM | POA: Diagnosis not present

## 2024-04-17 DIAGNOSIS — Z9884 Bariatric surgery status: Secondary | ICD-10-CM | POA: Insufficient documentation

## 2024-04-17 DIAGNOSIS — Z8249 Family history of ischemic heart disease and other diseases of the circulatory system: Secondary | ICD-10-CM | POA: Insufficient documentation

## 2024-04-17 LAB — IRON AND IRON BINDING CAPACITY (CC-WL,HP ONLY)
Iron: 64 ug/dL (ref 28–170)
Saturation Ratios: 26 % (ref 10.4–31.8)
TIBC: 248 ug/dL — ABNORMAL LOW (ref 250–450)
UIBC: 184 ug/dL (ref 148–442)

## 2024-04-17 LAB — CBC WITH DIFFERENTIAL (CANCER CENTER ONLY)
Abs Immature Granulocytes: 0.02 K/uL (ref 0.00–0.07)
Basophils Absolute: 0.1 K/uL (ref 0.0–0.1)
Basophils Relative: 1 %
Eosinophils Absolute: 0.3 K/uL (ref 0.0–0.5)
Eosinophils Relative: 3 %
HCT: 37.2 % (ref 36.0–46.0)
Hemoglobin: 12.6 g/dL (ref 12.0–15.0)
Immature Granulocytes: 0 %
Lymphocytes Relative: 30 %
Lymphs Abs: 2.8 K/uL (ref 0.7–4.0)
MCH: 29.1 pg (ref 26.0–34.0)
MCHC: 33.9 g/dL (ref 30.0–36.0)
MCV: 85.9 fL (ref 80.0–100.0)
Monocytes Absolute: 0.6 K/uL (ref 0.1–1.0)
Monocytes Relative: 6 %
Neutro Abs: 5.6 K/uL (ref 1.7–7.7)
Neutrophils Relative %: 60 %
Platelet Count: 239 K/uL (ref 150–400)
RBC: 4.33 MIL/uL (ref 3.87–5.11)
RDW: 16.5 % — ABNORMAL HIGH (ref 11.5–15.5)
WBC Count: 9.4 K/uL (ref 4.0–10.5)
nRBC: 0 % (ref 0.0–0.2)

## 2024-04-17 LAB — CMP (CANCER CENTER ONLY)
ALT: 11 U/L (ref 0–44)
AST: 15 U/L (ref 15–41)
Albumin: 4.2 g/dL (ref 3.5–5.0)
Alkaline Phosphatase: 43 U/L (ref 38–126)
Anion gap: 5 (ref 5–15)
BUN: 17 mg/dL (ref 6–20)
CO2: 29 mmol/L (ref 22–32)
Calcium: 9.3 mg/dL (ref 8.9–10.3)
Chloride: 106 mmol/L (ref 98–111)
Creatinine: 0.9 mg/dL (ref 0.44–1.00)
GFR, Estimated: >60 mL/min (ref >60)
Glucose, Bld: 94 mg/dL (ref 70–99)
Potassium: 3.7 mmol/L (ref 3.5–5.1)
Sodium: 140 mmol/L (ref 135–145)
Total Bilirubin: 0.3 mg/dL (ref 0.0–1.2)
Total Protein: 6.9 g/dL (ref 6.5–8.1)

## 2024-04-17 LAB — FOLATE: Folate: 13.1 ng/mL (ref >5.9)

## 2024-04-17 LAB — RETIC PANEL
Immature Retic Fract: 5.4 % (ref 2.3–15.9)
RBC.: 4.35 MIL/uL (ref 3.87–5.11)
Retic Count, Absolute: 51.3 K/uL (ref 19.0–186.0)
Retic Ct Pct: 1.2 % (ref 0.4–3.1)
Reticulocyte Hemoglobin: 34.7 pg (ref >27.9)

## 2024-04-17 LAB — SEDIMENTATION RATE: Sed Rate: 8 mm/h (ref 0–22)

## 2024-04-17 LAB — C-REACTIVE PROTEIN: CRP: 0.7 mg/dL (ref <1.0)

## 2024-04-17 LAB — VITAMIN B12: Vitamin B-12: 1704 pg/mL — ABNORMAL HIGH (ref 180–914)

## 2024-04-17 LAB — FERRITIN: Ferritin: 181 ng/mL (ref 11–307)
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
## 2024-04-22 LAB — METHYLMALONIC ACID, SERUM: Methylmalonic Acid, Quantitative: 130 nmol/L (ref 0–378)

## 2024-04-29 ENCOUNTER — Encounter: Payer: Self-pay | Admitting: Hematology and Oncology

## 2024-06-03 ENCOUNTER — Encounter: Payer: Self-pay | Admitting: Hematology and Oncology

## 2024-06-04 ENCOUNTER — Telehealth: Payer: Self-pay | Admitting: Hematology and Oncology

## 2024-06-05 ENCOUNTER — Inpatient Hospital Stay: Attending: Hematology and Oncology

## 2024-06-05 ENCOUNTER — Other Ambulatory Visit: Payer: Self-pay | Admitting: *Deleted

## 2024-06-05 DIAGNOSIS — N920 Excessive and frequent menstruation with regular cycle: Secondary | ICD-10-CM | POA: Insufficient documentation

## 2024-06-05 DIAGNOSIS — D500 Iron deficiency anemia secondary to blood loss (chronic): Secondary | ICD-10-CM

## 2024-06-05 DIAGNOSIS — D509 Iron deficiency anemia, unspecified: Secondary | ICD-10-CM | POA: Insufficient documentation

## 2024-06-05 LAB — IRON AND IRON BINDING CAPACITY (CC-WL,HP ONLY)
Iron: 108 ug/dL (ref 28–170)
Saturation Ratios: 33 % — ABNORMAL HIGH (ref 10.4–31.8)
TIBC: 326 ug/dL (ref 250–450)
UIBC: 218 ug/dL

## 2024-06-05 LAB — CMP (CANCER CENTER ONLY)
ALT: 11 U/L (ref 0–44)
AST: 16 U/L (ref 15–41)
Albumin: 4.5 g/dL (ref 3.5–5.0)
Alkaline Phosphatase: 46 U/L (ref 38–126)
Anion gap: 10 (ref 5–15)
BUN: 19 mg/dL (ref 6–20)
CO2: 26 mmol/L (ref 22–32)
Calcium: 9.5 mg/dL (ref 8.9–10.3)
Chloride: 104 mmol/L (ref 98–111)
Creatinine: 0.68 mg/dL (ref 0.44–1.00)
GFR, Estimated: >60 mL/min (ref >60)
Glucose, Bld: 88 mg/dL (ref 70–99)
Potassium: 4.4 mmol/L (ref 3.5–5.1)
Sodium: 139 mmol/L (ref 135–145)
Total Bilirubin: 0.5 mg/dL (ref 0.0–1.2)
Total Protein: 7.5 g/dL (ref 6.5–8.1)

## 2024-06-05 LAB — CBC WITH DIFFERENTIAL (CANCER CENTER ONLY)
Abs Immature Granulocytes: 0.02 K/uL (ref 0.00–0.07)
Basophils Absolute: 0.1 K/uL (ref 0.0–0.1)
Basophils Relative: 1 %
Eosinophils Absolute: 0.2 K/uL (ref 0.0–0.5)
Eosinophils Relative: 1 %
HCT: 41.3 % (ref 36.0–46.0)
Hemoglobin: 14.1 g/dL (ref 12.0–15.0)
Immature Granulocytes: 0 %
Lymphocytes Relative: 19 %
Lymphs Abs: 2 K/uL (ref 0.7–4.0)
MCH: 30.4 pg (ref 26.0–34.0)
MCHC: 34.1 g/dL (ref 30.0–36.0)
MCV: 89 fL (ref 80.0–100.0)
Monocytes Absolute: 0.7 K/uL (ref 0.1–1.0)
Monocytes Relative: 6 %
Neutro Abs: 7.6 K/uL (ref 1.7–7.7)
Neutrophils Relative %: 73 %
Platelet Count: 253 K/uL (ref 150–400)
RBC: 4.64 MIL/uL (ref 3.87–5.11)
RDW: 14.2 % (ref 11.5–15.5)
WBC Count: 10.6 K/uL — ABNORMAL HIGH (ref 4.0–10.5)
nRBC: 0 % (ref 0.0–0.2)

## 2024-06-05 LAB — FERRITIN: Ferritin: 112 ng/mL (ref 11–307)

## 2024-06-05 LAB — RETIC PANEL
Immature Retic Fract: 8.7 % (ref 2.3–15.9)
RBC.: 4.58 MIL/uL (ref 3.87–5.11)
Retic Count, Absolute: 53.6 K/uL (ref 19.0–186.0)
Retic Ct Pct: 1.2 % (ref 0.4–3.1)
Reticulocyte Hemoglobin: 35.2 pg (ref >27.9)

## 2024-06-07 ENCOUNTER — Ambulatory Visit: Payer: Self-pay

## 2024-06-07 NOTE — Telephone Encounter (Signed)
-----   Message from Nurse Almarie T sent at 06/07/2024  2:04 PM EST -----  ----- Message ----- From: Federico Norleen ONEIDA MADISON, MD Sent: 06/05/2024   4:13 PM EST To: Almarie DELENA Arabia, RN  Please let Mrs. Fonseca know that her iron levels are still strong, with ferritin >100. Her Hgb also remains strong, she is not currently anemic. Her symptoms are not caused by her blood or iron  levels.  ----- Message ----- From: Rebecka, [facility] [place] Sent: 06/05/2024   9:18 AM EST To: Norleen ONEIDA Federico MADISON, MD

## 2024-06-07 NOTE — Telephone Encounter (Signed)
  TC to Mrs. Petrik regarding recent lab results. Spoke with pt and  let her know per Dr Federico  her iron levels are still strong, with ferritin >100. Her Hgb also remains strong, she is not currently anemic. Her symptoms are not caused by her blood or iron levels.  Pt verbalized understanding and stated she would follow up with her PCP.
